# Patient Record
Sex: Female | Born: 1950 | Race: Black or African American | Hispanic: No | Marital: Single | Smoking: Never smoker
Health system: Southern US, Community
[De-identification: ages and names within clinical notes are randomized; demographics above are authoritative.]

## PROBLEM LIST (undated history)

## (undated) DIAGNOSIS — M109 Gout, unspecified: Secondary | ICD-10-CM

## (undated) DIAGNOSIS — K529 Noninfective gastroenteritis and colitis, unspecified: Secondary | ICD-10-CM

## (undated) DIAGNOSIS — N289 Disorder of kidney and ureter, unspecified: Secondary | ICD-10-CM

## (undated) DIAGNOSIS — I509 Heart failure, unspecified: Secondary | ICD-10-CM

## (undated) DIAGNOSIS — K56609 Unspecified intestinal obstruction, unspecified as to partial versus complete obstruction: Secondary | ICD-10-CM

## (undated) DIAGNOSIS — I10 Essential (primary) hypertension: Secondary | ICD-10-CM

## (undated) DIAGNOSIS — E119 Type 2 diabetes mellitus without complications: Secondary | ICD-10-CM

## (undated) HISTORY — PX: AV FISTULA PLACEMENT: SHX1204

## (undated) HISTORY — PX: CHOLECYSTECTOMY: SHX55

---

## 2015-12-18 DIAGNOSIS — N2581 Secondary hyperparathyroidism of renal origin: Secondary | ICD-10-CM | POA: Diagnosis not present

## 2015-12-18 DIAGNOSIS — N186 End stage renal disease: Secondary | ICD-10-CM | POA: Diagnosis not present

## 2015-12-18 DIAGNOSIS — D631 Anemia in chronic kidney disease: Secondary | ICD-10-CM | POA: Diagnosis not present

## 2015-12-18 DIAGNOSIS — E538 Deficiency of other specified B group vitamins: Secondary | ICD-10-CM | POA: Diagnosis not present

## 2015-12-18 DIAGNOSIS — E46 Unspecified protein-calorie malnutrition: Secondary | ICD-10-CM | POA: Diagnosis not present

## 2015-12-18 DIAGNOSIS — D509 Iron deficiency anemia, unspecified: Secondary | ICD-10-CM | POA: Diagnosis not present

## 2015-12-18 DIAGNOSIS — D649 Anemia, unspecified: Secondary | ICD-10-CM | POA: Diagnosis not present

## 2015-12-18 DIAGNOSIS — E875 Hyperkalemia: Secondary | ICD-10-CM | POA: Diagnosis not present

## 2015-12-18 DIAGNOSIS — R748 Abnormal levels of other serum enzymes: Secondary | ICD-10-CM | POA: Diagnosis not present

## 2015-12-20 DIAGNOSIS — Z79899 Other long term (current) drug therapy: Secondary | ICD-10-CM | POA: Diagnosis not present

## 2015-12-20 DIAGNOSIS — Z789 Other specified health status: Secondary | ICD-10-CM | POA: Diagnosis not present

## 2015-12-20 DIAGNOSIS — N2581 Secondary hyperparathyroidism of renal origin: Secondary | ICD-10-CM | POA: Diagnosis not present

## 2015-12-20 DIAGNOSIS — D649 Anemia, unspecified: Secondary | ICD-10-CM | POA: Diagnosis not present

## 2015-12-20 DIAGNOSIS — E559 Vitamin D deficiency, unspecified: Secondary | ICD-10-CM | POA: Diagnosis not present

## 2015-12-20 DIAGNOSIS — E538 Deficiency of other specified B group vitamins: Secondary | ICD-10-CM | POA: Diagnosis not present

## 2015-12-20 DIAGNOSIS — R7989 Other specified abnormal findings of blood chemistry: Secondary | ICD-10-CM | POA: Diagnosis not present

## 2015-12-20 DIAGNOSIS — E785 Hyperlipidemia, unspecified: Secondary | ICD-10-CM | POA: Diagnosis not present

## 2015-12-20 DIAGNOSIS — D631 Anemia in chronic kidney disease: Secondary | ICD-10-CM | POA: Diagnosis not present

## 2015-12-20 DIAGNOSIS — N186 End stage renal disease: Secondary | ICD-10-CM | POA: Diagnosis not present

## 2015-12-21 DIAGNOSIS — F332 Major depressive disorder, recurrent severe without psychotic features: Secondary | ICD-10-CM | POA: Diagnosis not present

## 2015-12-22 DIAGNOSIS — N186 End stage renal disease: Secondary | ICD-10-CM | POA: Diagnosis not present

## 2015-12-22 DIAGNOSIS — N2581 Secondary hyperparathyroidism of renal origin: Secondary | ICD-10-CM | POA: Diagnosis not present

## 2015-12-22 DIAGNOSIS — E538 Deficiency of other specified B group vitamins: Secondary | ICD-10-CM | POA: Diagnosis not present

## 2015-12-22 DIAGNOSIS — D649 Anemia, unspecified: Secondary | ICD-10-CM | POA: Diagnosis not present

## 2015-12-22 DIAGNOSIS — D631 Anemia in chronic kidney disease: Secondary | ICD-10-CM | POA: Diagnosis not present

## 2015-12-25 DIAGNOSIS — I10 Essential (primary) hypertension: Secondary | ICD-10-CM | POA: Diagnosis not present

## 2015-12-25 DIAGNOSIS — N2581 Secondary hyperparathyroidism of renal origin: Secondary | ICD-10-CM | POA: Diagnosis not present

## 2015-12-25 DIAGNOSIS — E785 Hyperlipidemia, unspecified: Secondary | ICD-10-CM | POA: Diagnosis not present

## 2015-12-25 DIAGNOSIS — D631 Anemia in chronic kidney disease: Secondary | ICD-10-CM | POA: Diagnosis not present

## 2015-12-25 DIAGNOSIS — E538 Deficiency of other specified B group vitamins: Secondary | ICD-10-CM | POA: Diagnosis not present

## 2015-12-25 DIAGNOSIS — N185 Chronic kidney disease, stage 5: Secondary | ICD-10-CM | POA: Diagnosis not present

## 2015-12-25 DIAGNOSIS — D649 Anemia, unspecified: Secondary | ICD-10-CM | POA: Diagnosis not present

## 2015-12-25 DIAGNOSIS — N186 End stage renal disease: Secondary | ICD-10-CM | POA: Diagnosis not present

## 2015-12-27 DIAGNOSIS — D649 Anemia, unspecified: Secondary | ICD-10-CM | POA: Diagnosis not present

## 2015-12-27 DIAGNOSIS — N2581 Secondary hyperparathyroidism of renal origin: Secondary | ICD-10-CM | POA: Diagnosis not present

## 2015-12-27 DIAGNOSIS — E538 Deficiency of other specified B group vitamins: Secondary | ICD-10-CM | POA: Diagnosis not present

## 2015-12-27 DIAGNOSIS — D631 Anemia in chronic kidney disease: Secondary | ICD-10-CM | POA: Diagnosis not present

## 2015-12-27 DIAGNOSIS — N186 End stage renal disease: Secondary | ICD-10-CM | POA: Diagnosis not present

## 2015-12-28 DIAGNOSIS — F332 Major depressive disorder, recurrent severe without psychotic features: Secondary | ICD-10-CM | POA: Diagnosis not present

## 2015-12-29 DIAGNOSIS — N2581 Secondary hyperparathyroidism of renal origin: Secondary | ICD-10-CM | POA: Diagnosis not present

## 2015-12-29 DIAGNOSIS — E538 Deficiency of other specified B group vitamins: Secondary | ICD-10-CM | POA: Diagnosis not present

## 2015-12-29 DIAGNOSIS — D649 Anemia, unspecified: Secondary | ICD-10-CM | POA: Diagnosis not present

## 2015-12-29 DIAGNOSIS — N186 End stage renal disease: Secondary | ICD-10-CM | POA: Diagnosis not present

## 2015-12-29 DIAGNOSIS — D631 Anemia in chronic kidney disease: Secondary | ICD-10-CM | POA: Diagnosis not present

## 2016-01-01 DIAGNOSIS — N2581 Secondary hyperparathyroidism of renal origin: Secondary | ICD-10-CM | POA: Diagnosis not present

## 2016-01-01 DIAGNOSIS — D631 Anemia in chronic kidney disease: Secondary | ICD-10-CM | POA: Diagnosis not present

## 2016-01-01 DIAGNOSIS — E538 Deficiency of other specified B group vitamins: Secondary | ICD-10-CM | POA: Diagnosis not present

## 2016-01-01 DIAGNOSIS — D649 Anemia, unspecified: Secondary | ICD-10-CM | POA: Diagnosis not present

## 2016-01-01 DIAGNOSIS — N186 End stage renal disease: Secondary | ICD-10-CM | POA: Diagnosis not present

## 2016-01-03 DIAGNOSIS — N186 End stage renal disease: Secondary | ICD-10-CM | POA: Diagnosis not present

## 2016-01-03 DIAGNOSIS — N2581 Secondary hyperparathyroidism of renal origin: Secondary | ICD-10-CM | POA: Diagnosis not present

## 2016-01-03 DIAGNOSIS — Z23 Encounter for immunization: Secondary | ICD-10-CM | POA: Diagnosis not present

## 2016-01-03 DIAGNOSIS — D631 Anemia in chronic kidney disease: Secondary | ICD-10-CM | POA: Diagnosis not present

## 2016-01-05 DIAGNOSIS — N2581 Secondary hyperparathyroidism of renal origin: Secondary | ICD-10-CM | POA: Diagnosis not present

## 2016-01-05 DIAGNOSIS — Z23 Encounter for immunization: Secondary | ICD-10-CM | POA: Diagnosis not present

## 2016-01-05 DIAGNOSIS — D631 Anemia in chronic kidney disease: Secondary | ICD-10-CM | POA: Diagnosis not present

## 2016-01-05 DIAGNOSIS — F332 Major depressive disorder, recurrent severe without psychotic features: Secondary | ICD-10-CM | POA: Diagnosis not present

## 2016-01-05 DIAGNOSIS — N186 End stage renal disease: Secondary | ICD-10-CM | POA: Diagnosis not present

## 2016-01-08 DIAGNOSIS — N2581 Secondary hyperparathyroidism of renal origin: Secondary | ICD-10-CM | POA: Diagnosis not present

## 2016-01-08 DIAGNOSIS — D631 Anemia in chronic kidney disease: Secondary | ICD-10-CM | POA: Diagnosis not present

## 2016-01-08 DIAGNOSIS — N186 End stage renal disease: Secondary | ICD-10-CM | POA: Diagnosis not present

## 2016-01-08 DIAGNOSIS — Z23 Encounter for immunization: Secondary | ICD-10-CM | POA: Diagnosis not present

## 2016-01-10 DIAGNOSIS — N186 End stage renal disease: Secondary | ICD-10-CM | POA: Diagnosis not present

## 2016-01-10 DIAGNOSIS — N2581 Secondary hyperparathyroidism of renal origin: Secondary | ICD-10-CM | POA: Diagnosis not present

## 2016-01-10 DIAGNOSIS — Z23 Encounter for immunization: Secondary | ICD-10-CM | POA: Diagnosis not present

## 2016-01-10 DIAGNOSIS — D631 Anemia in chronic kidney disease: Secondary | ICD-10-CM | POA: Diagnosis not present

## 2016-01-12 DIAGNOSIS — N2581 Secondary hyperparathyroidism of renal origin: Secondary | ICD-10-CM | POA: Diagnosis not present

## 2016-01-12 DIAGNOSIS — D631 Anemia in chronic kidney disease: Secondary | ICD-10-CM | POA: Diagnosis not present

## 2016-01-12 DIAGNOSIS — Z23 Encounter for immunization: Secondary | ICD-10-CM | POA: Diagnosis not present

## 2016-01-12 DIAGNOSIS — F332 Major depressive disorder, recurrent severe without psychotic features: Secondary | ICD-10-CM | POA: Diagnosis not present

## 2016-01-12 DIAGNOSIS — N186 End stage renal disease: Secondary | ICD-10-CM | POA: Diagnosis not present

## 2016-01-15 DIAGNOSIS — D631 Anemia in chronic kidney disease: Secondary | ICD-10-CM | POA: Diagnosis not present

## 2016-01-15 DIAGNOSIS — N2581 Secondary hyperparathyroidism of renal origin: Secondary | ICD-10-CM | POA: Diagnosis not present

## 2016-01-15 DIAGNOSIS — N186 End stage renal disease: Secondary | ICD-10-CM | POA: Diagnosis not present

## 2016-01-15 DIAGNOSIS — Z23 Encounter for immunization: Secondary | ICD-10-CM | POA: Diagnosis not present

## 2016-01-16 DIAGNOSIS — N186 End stage renal disease: Secondary | ICD-10-CM | POA: Diagnosis not present

## 2016-01-17 DIAGNOSIS — R748 Abnormal levels of other serum enzymes: Secondary | ICD-10-CM | POA: Diagnosis not present

## 2016-01-17 DIAGNOSIS — E785 Hyperlipidemia, unspecified: Secondary | ICD-10-CM | POA: Diagnosis not present

## 2016-01-17 DIAGNOSIS — D509 Iron deficiency anemia, unspecified: Secondary | ICD-10-CM | POA: Diagnosis not present

## 2016-01-17 DIAGNOSIS — E46 Unspecified protein-calorie malnutrition: Secondary | ICD-10-CM | POA: Diagnosis not present

## 2016-01-17 DIAGNOSIS — E875 Hyperkalemia: Secondary | ICD-10-CM | POA: Diagnosis not present

## 2016-01-17 DIAGNOSIS — N186 End stage renal disease: Secondary | ICD-10-CM | POA: Diagnosis not present

## 2016-01-17 DIAGNOSIS — N2581 Secondary hyperparathyroidism of renal origin: Secondary | ICD-10-CM | POA: Diagnosis not present

## 2016-01-17 DIAGNOSIS — D649 Anemia, unspecified: Secondary | ICD-10-CM | POA: Diagnosis not present

## 2016-01-17 DIAGNOSIS — D631 Anemia in chronic kidney disease: Secondary | ICD-10-CM | POA: Diagnosis not present

## 2016-01-17 DIAGNOSIS — Z79899 Other long term (current) drug therapy: Secondary | ICD-10-CM | POA: Diagnosis not present

## 2016-01-17 DIAGNOSIS — E538 Deficiency of other specified B group vitamins: Secondary | ICD-10-CM | POA: Diagnosis not present

## 2016-01-18 DIAGNOSIS — I701 Atherosclerosis of renal artery: Secondary | ICD-10-CM | POA: Diagnosis not present

## 2016-01-18 DIAGNOSIS — N185 Chronic kidney disease, stage 5: Secondary | ICD-10-CM | POA: Diagnosis not present

## 2016-01-18 DIAGNOSIS — E785 Hyperlipidemia, unspecified: Secondary | ICD-10-CM | POA: Diagnosis not present

## 2016-01-18 DIAGNOSIS — I10 Essential (primary) hypertension: Secondary | ICD-10-CM | POA: Diagnosis not present

## 2016-01-19 DIAGNOSIS — N2581 Secondary hyperparathyroidism of renal origin: Secondary | ICD-10-CM | POA: Diagnosis not present

## 2016-01-19 DIAGNOSIS — N186 End stage renal disease: Secondary | ICD-10-CM | POA: Diagnosis not present

## 2016-01-19 DIAGNOSIS — D631 Anemia in chronic kidney disease: Secondary | ICD-10-CM | POA: Diagnosis not present

## 2016-01-19 DIAGNOSIS — E538 Deficiency of other specified B group vitamins: Secondary | ICD-10-CM | POA: Diagnosis not present

## 2016-01-19 DIAGNOSIS — D649 Anemia, unspecified: Secondary | ICD-10-CM | POA: Diagnosis not present

## 2016-01-22 DIAGNOSIS — D649 Anemia, unspecified: Secondary | ICD-10-CM | POA: Diagnosis not present

## 2016-01-22 DIAGNOSIS — N186 End stage renal disease: Secondary | ICD-10-CM | POA: Diagnosis not present

## 2016-01-22 DIAGNOSIS — E538 Deficiency of other specified B group vitamins: Secondary | ICD-10-CM | POA: Diagnosis not present

## 2016-01-22 DIAGNOSIS — N2581 Secondary hyperparathyroidism of renal origin: Secondary | ICD-10-CM | POA: Diagnosis not present

## 2016-01-22 DIAGNOSIS — D631 Anemia in chronic kidney disease: Secondary | ICD-10-CM | POA: Diagnosis not present

## 2016-01-24 DIAGNOSIS — D649 Anemia, unspecified: Secondary | ICD-10-CM | POA: Diagnosis not present

## 2016-01-24 DIAGNOSIS — E538 Deficiency of other specified B group vitamins: Secondary | ICD-10-CM | POA: Diagnosis not present

## 2016-01-24 DIAGNOSIS — D631 Anemia in chronic kidney disease: Secondary | ICD-10-CM | POA: Diagnosis not present

## 2016-01-24 DIAGNOSIS — N186 End stage renal disease: Secondary | ICD-10-CM | POA: Diagnosis not present

## 2016-01-24 DIAGNOSIS — N2581 Secondary hyperparathyroidism of renal origin: Secondary | ICD-10-CM | POA: Diagnosis not present

## 2016-01-25 DIAGNOSIS — D631 Anemia in chronic kidney disease: Secondary | ICD-10-CM | POA: Diagnosis not present

## 2016-01-25 DIAGNOSIS — T82590A Other mechanical complication of surgically created arteriovenous fistula, initial encounter: Secondary | ICD-10-CM | POA: Diagnosis not present

## 2016-01-25 DIAGNOSIS — I10 Essential (primary) hypertension: Secondary | ICD-10-CM | POA: Diagnosis not present

## 2016-01-25 DIAGNOSIS — N185 Chronic kidney disease, stage 5: Secondary | ICD-10-CM | POA: Diagnosis not present

## 2016-01-25 DIAGNOSIS — T82868A Thrombosis of vascular prosthetic devices, implants and grafts, initial encounter: Secondary | ICD-10-CM | POA: Diagnosis not present

## 2016-01-25 DIAGNOSIS — I701 Atherosclerosis of renal artery: Secondary | ICD-10-CM | POA: Diagnosis not present

## 2016-01-26 DIAGNOSIS — N186 End stage renal disease: Secondary | ICD-10-CM | POA: Diagnosis not present

## 2016-01-26 DIAGNOSIS — N2581 Secondary hyperparathyroidism of renal origin: Secondary | ICD-10-CM | POA: Diagnosis not present

## 2016-01-26 DIAGNOSIS — D649 Anemia, unspecified: Secondary | ICD-10-CM | POA: Diagnosis not present

## 2016-01-26 DIAGNOSIS — E538 Deficiency of other specified B group vitamins: Secondary | ICD-10-CM | POA: Diagnosis not present

## 2016-01-26 DIAGNOSIS — D631 Anemia in chronic kidney disease: Secondary | ICD-10-CM | POA: Diagnosis not present

## 2016-01-29 DIAGNOSIS — N186 End stage renal disease: Secondary | ICD-10-CM | POA: Diagnosis not present

## 2016-01-29 DIAGNOSIS — D631 Anemia in chronic kidney disease: Secondary | ICD-10-CM | POA: Diagnosis not present

## 2016-01-29 DIAGNOSIS — N2581 Secondary hyperparathyroidism of renal origin: Secondary | ICD-10-CM | POA: Diagnosis not present

## 2016-01-29 DIAGNOSIS — D649 Anemia, unspecified: Secondary | ICD-10-CM | POA: Diagnosis not present

## 2016-01-29 DIAGNOSIS — E538 Deficiency of other specified B group vitamins: Secondary | ICD-10-CM | POA: Diagnosis not present

## 2016-01-31 DIAGNOSIS — N2581 Secondary hyperparathyroidism of renal origin: Secondary | ICD-10-CM | POA: Diagnosis not present

## 2016-01-31 DIAGNOSIS — E46 Unspecified protein-calorie malnutrition: Secondary | ICD-10-CM | POA: Diagnosis not present

## 2016-01-31 DIAGNOSIS — N186 End stage renal disease: Secondary | ICD-10-CM | POA: Diagnosis not present

## 2016-02-02 DIAGNOSIS — E46 Unspecified protein-calorie malnutrition: Secondary | ICD-10-CM | POA: Diagnosis not present

## 2016-02-02 DIAGNOSIS — N2581 Secondary hyperparathyroidism of renal origin: Secondary | ICD-10-CM | POA: Diagnosis not present

## 2016-02-02 DIAGNOSIS — N186 End stage renal disease: Secondary | ICD-10-CM | POA: Diagnosis not present

## 2016-02-05 DIAGNOSIS — E46 Unspecified protein-calorie malnutrition: Secondary | ICD-10-CM | POA: Diagnosis not present

## 2016-02-05 DIAGNOSIS — N186 End stage renal disease: Secondary | ICD-10-CM | POA: Diagnosis not present

## 2016-02-05 DIAGNOSIS — N2581 Secondary hyperparathyroidism of renal origin: Secondary | ICD-10-CM | POA: Diagnosis not present

## 2016-02-06 DIAGNOSIS — E46 Unspecified protein-calorie malnutrition: Secondary | ICD-10-CM | POA: Diagnosis not present

## 2016-02-06 DIAGNOSIS — N2581 Secondary hyperparathyroidism of renal origin: Secondary | ICD-10-CM | POA: Diagnosis not present

## 2016-02-06 DIAGNOSIS — N186 End stage renal disease: Secondary | ICD-10-CM | POA: Diagnosis not present

## 2016-02-08 DIAGNOSIS — N186 End stage renal disease: Secondary | ICD-10-CM | POA: Diagnosis not present

## 2016-02-08 DIAGNOSIS — L299 Pruritus, unspecified: Secondary | ICD-10-CM | POA: Diagnosis not present

## 2016-02-08 DIAGNOSIS — Z992 Dependence on renal dialysis: Secondary | ICD-10-CM | POA: Diagnosis not present

## 2016-02-08 DIAGNOSIS — F419 Anxiety disorder, unspecified: Secondary | ICD-10-CM | POA: Diagnosis not present

## 2016-02-10 DIAGNOSIS — Z992 Dependence on renal dialysis: Secondary | ICD-10-CM | POA: Diagnosis not present

## 2016-02-10 DIAGNOSIS — L299 Pruritus, unspecified: Secondary | ICD-10-CM | POA: Diagnosis not present

## 2016-02-10 DIAGNOSIS — N186 End stage renal disease: Secondary | ICD-10-CM | POA: Diagnosis not present

## 2016-02-10 DIAGNOSIS — F419 Anxiety disorder, unspecified: Secondary | ICD-10-CM | POA: Diagnosis not present

## 2016-02-12 DIAGNOSIS — L299 Pruritus, unspecified: Secondary | ICD-10-CM | POA: Diagnosis not present

## 2016-02-12 DIAGNOSIS — Z992 Dependence on renal dialysis: Secondary | ICD-10-CM | POA: Diagnosis not present

## 2016-02-12 DIAGNOSIS — N186 End stage renal disease: Secondary | ICD-10-CM | POA: Diagnosis not present

## 2016-02-12 DIAGNOSIS — F419 Anxiety disorder, unspecified: Secondary | ICD-10-CM | POA: Diagnosis not present

## 2016-02-13 DIAGNOSIS — N186 End stage renal disease: Secondary | ICD-10-CM | POA: Diagnosis not present

## 2016-02-14 DIAGNOSIS — N186 End stage renal disease: Secondary | ICD-10-CM | POA: Diagnosis not present

## 2016-02-14 DIAGNOSIS — Z992 Dependence on renal dialysis: Secondary | ICD-10-CM | POA: Diagnosis not present

## 2016-02-14 DIAGNOSIS — F419 Anxiety disorder, unspecified: Secondary | ICD-10-CM | POA: Diagnosis not present

## 2016-02-14 DIAGNOSIS — L299 Pruritus, unspecified: Secondary | ICD-10-CM | POA: Diagnosis not present

## 2016-02-16 DIAGNOSIS — N186 End stage renal disease: Secondary | ICD-10-CM | POA: Diagnosis not present

## 2016-02-16 DIAGNOSIS — Z992 Dependence on renal dialysis: Secondary | ICD-10-CM | POA: Diagnosis not present

## 2016-02-16 DIAGNOSIS — F419 Anxiety disorder, unspecified: Secondary | ICD-10-CM | POA: Diagnosis not present

## 2016-02-16 DIAGNOSIS — L299 Pruritus, unspecified: Secondary | ICD-10-CM | POA: Diagnosis not present

## 2016-02-19 DIAGNOSIS — Z992 Dependence on renal dialysis: Secondary | ICD-10-CM | POA: Diagnosis not present

## 2016-02-19 DIAGNOSIS — F419 Anxiety disorder, unspecified: Secondary | ICD-10-CM | POA: Diagnosis not present

## 2016-02-19 DIAGNOSIS — L299 Pruritus, unspecified: Secondary | ICD-10-CM | POA: Diagnosis not present

## 2016-02-19 DIAGNOSIS — N186 End stage renal disease: Secondary | ICD-10-CM | POA: Diagnosis not present

## 2016-02-21 DIAGNOSIS — E785 Hyperlipidemia, unspecified: Secondary | ICD-10-CM | POA: Diagnosis not present

## 2016-02-21 DIAGNOSIS — E46 Unspecified protein-calorie malnutrition: Secondary | ICD-10-CM | POA: Diagnosis not present

## 2016-02-21 DIAGNOSIS — N2581 Secondary hyperparathyroidism of renal origin: Secondary | ICD-10-CM | POA: Diagnosis not present

## 2016-02-21 DIAGNOSIS — N186 End stage renal disease: Secondary | ICD-10-CM | POA: Diagnosis not present

## 2016-02-21 DIAGNOSIS — Z79899 Other long term (current) drug therapy: Secondary | ICD-10-CM | POA: Diagnosis not present

## 2016-02-22 DIAGNOSIS — N186 End stage renal disease: Secondary | ICD-10-CM | POA: Diagnosis not present

## 2016-02-22 DIAGNOSIS — T82590A Other mechanical complication of surgically created arteriovenous fistula, initial encounter: Secondary | ICD-10-CM | POA: Diagnosis not present

## 2016-02-22 DIAGNOSIS — E8779 Other fluid overload: Secondary | ICD-10-CM | POA: Diagnosis not present

## 2016-02-23 DIAGNOSIS — E46 Unspecified protein-calorie malnutrition: Secondary | ICD-10-CM | POA: Diagnosis not present

## 2016-02-23 DIAGNOSIS — N2581 Secondary hyperparathyroidism of renal origin: Secondary | ICD-10-CM | POA: Diagnosis not present

## 2016-02-23 DIAGNOSIS — N186 End stage renal disease: Secondary | ICD-10-CM | POA: Diagnosis not present

## 2016-02-23 DIAGNOSIS — F332 Major depressive disorder, recurrent severe without psychotic features: Secondary | ICD-10-CM | POA: Diagnosis not present

## 2016-02-26 DIAGNOSIS — E46 Unspecified protein-calorie malnutrition: Secondary | ICD-10-CM | POA: Diagnosis not present

## 2016-02-26 DIAGNOSIS — N2581 Secondary hyperparathyroidism of renal origin: Secondary | ICD-10-CM | POA: Diagnosis not present

## 2016-02-26 DIAGNOSIS — N186 End stage renal disease: Secondary | ICD-10-CM | POA: Diagnosis not present

## 2016-02-28 DIAGNOSIS — N2581 Secondary hyperparathyroidism of renal origin: Secondary | ICD-10-CM | POA: Diagnosis not present

## 2016-02-28 DIAGNOSIS — E46 Unspecified protein-calorie malnutrition: Secondary | ICD-10-CM | POA: Diagnosis not present

## 2016-02-28 DIAGNOSIS — N186 End stage renal disease: Secondary | ICD-10-CM | POA: Diagnosis not present

## 2016-03-01 DIAGNOSIS — N2581 Secondary hyperparathyroidism of renal origin: Secondary | ICD-10-CM | POA: Diagnosis not present

## 2016-03-01 DIAGNOSIS — N186 End stage renal disease: Secondary | ICD-10-CM | POA: Diagnosis not present

## 2016-03-04 DIAGNOSIS — N2581 Secondary hyperparathyroidism of renal origin: Secondary | ICD-10-CM | POA: Diagnosis not present

## 2016-03-04 DIAGNOSIS — N186 End stage renal disease: Secondary | ICD-10-CM | POA: Diagnosis not present

## 2016-03-06 DIAGNOSIS — N2581 Secondary hyperparathyroidism of renal origin: Secondary | ICD-10-CM | POA: Diagnosis not present

## 2016-03-06 DIAGNOSIS — N186 End stage renal disease: Secondary | ICD-10-CM | POA: Diagnosis not present

## 2016-03-08 DIAGNOSIS — N186 End stage renal disease: Secondary | ICD-10-CM | POA: Diagnosis not present

## 2016-03-08 DIAGNOSIS — N2581 Secondary hyperparathyroidism of renal origin: Secondary | ICD-10-CM | POA: Diagnosis not present

## 2016-03-08 DIAGNOSIS — F332 Major depressive disorder, recurrent severe without psychotic features: Secondary | ICD-10-CM | POA: Diagnosis not present

## 2016-03-11 DIAGNOSIS — N2581 Secondary hyperparathyroidism of renal origin: Secondary | ICD-10-CM | POA: Diagnosis not present

## 2016-03-11 DIAGNOSIS — N186 End stage renal disease: Secondary | ICD-10-CM | POA: Diagnosis not present

## 2016-03-13 DIAGNOSIS — N186 End stage renal disease: Secondary | ICD-10-CM | POA: Diagnosis not present

## 2016-03-13 DIAGNOSIS — N2581 Secondary hyperparathyroidism of renal origin: Secondary | ICD-10-CM | POA: Diagnosis not present

## 2016-03-15 DIAGNOSIS — F332 Major depressive disorder, recurrent severe without psychotic features: Secondary | ICD-10-CM | POA: Diagnosis not present

## 2016-03-15 DIAGNOSIS — N2581 Secondary hyperparathyroidism of renal origin: Secondary | ICD-10-CM | POA: Diagnosis not present

## 2016-03-15 DIAGNOSIS — N186 End stage renal disease: Secondary | ICD-10-CM | POA: Diagnosis not present

## 2016-03-18 DIAGNOSIS — D509 Iron deficiency anemia, unspecified: Secondary | ICD-10-CM | POA: Diagnosis not present

## 2016-03-18 DIAGNOSIS — E875 Hyperkalemia: Secondary | ICD-10-CM | POA: Diagnosis not present

## 2016-03-18 DIAGNOSIS — D631 Anemia in chronic kidney disease: Secondary | ICD-10-CM | POA: Diagnosis not present

## 2016-03-18 DIAGNOSIS — D649 Anemia, unspecified: Secondary | ICD-10-CM | POA: Diagnosis not present

## 2016-03-18 DIAGNOSIS — R748 Abnormal levels of other serum enzymes: Secondary | ICD-10-CM | POA: Diagnosis not present

## 2016-03-18 DIAGNOSIS — E46 Unspecified protein-calorie malnutrition: Secondary | ICD-10-CM | POA: Diagnosis not present

## 2016-03-18 DIAGNOSIS — N186 End stage renal disease: Secondary | ICD-10-CM | POA: Diagnosis not present

## 2016-03-18 DIAGNOSIS — N2581 Secondary hyperparathyroidism of renal origin: Secondary | ICD-10-CM | POA: Diagnosis not present

## 2016-03-18 DIAGNOSIS — E538 Deficiency of other specified B group vitamins: Secondary | ICD-10-CM | POA: Diagnosis not present

## 2016-03-19 DIAGNOSIS — I10 Essential (primary) hypertension: Secondary | ICD-10-CM | POA: Diagnosis not present

## 2016-03-19 DIAGNOSIS — E785 Hyperlipidemia, unspecified: Secondary | ICD-10-CM | POA: Diagnosis not present

## 2016-03-19 DIAGNOSIS — N186 End stage renal disease: Secondary | ICD-10-CM | POA: Diagnosis not present

## 2016-03-19 DIAGNOSIS — I770 Arteriovenous fistula, acquired: Secondary | ICD-10-CM | POA: Diagnosis not present

## 2016-03-20 DIAGNOSIS — N186 End stage renal disease: Secondary | ICD-10-CM | POA: Diagnosis not present

## 2016-03-20 DIAGNOSIS — D631 Anemia in chronic kidney disease: Secondary | ICD-10-CM | POA: Diagnosis not present

## 2016-03-20 DIAGNOSIS — E538 Deficiency of other specified B group vitamins: Secondary | ICD-10-CM | POA: Diagnosis not present

## 2016-03-20 DIAGNOSIS — D649 Anemia, unspecified: Secondary | ICD-10-CM | POA: Diagnosis not present

## 2016-03-20 DIAGNOSIS — Z79899 Other long term (current) drug therapy: Secondary | ICD-10-CM | POA: Diagnosis not present

## 2016-03-20 DIAGNOSIS — N2581 Secondary hyperparathyroidism of renal origin: Secondary | ICD-10-CM | POA: Diagnosis not present

## 2016-03-20 DIAGNOSIS — E785 Hyperlipidemia, unspecified: Secondary | ICD-10-CM | POA: Diagnosis not present

## 2016-03-22 DIAGNOSIS — F332 Major depressive disorder, recurrent severe without psychotic features: Secondary | ICD-10-CM | POA: Diagnosis not present

## 2016-03-22 DIAGNOSIS — E538 Deficiency of other specified B group vitamins: Secondary | ICD-10-CM | POA: Diagnosis not present

## 2016-03-22 DIAGNOSIS — D649 Anemia, unspecified: Secondary | ICD-10-CM | POA: Diagnosis not present

## 2016-03-22 DIAGNOSIS — D631 Anemia in chronic kidney disease: Secondary | ICD-10-CM | POA: Diagnosis not present

## 2016-03-22 DIAGNOSIS — N186 End stage renal disease: Secondary | ICD-10-CM | POA: Diagnosis not present

## 2016-03-22 DIAGNOSIS — N2581 Secondary hyperparathyroidism of renal origin: Secondary | ICD-10-CM | POA: Diagnosis not present

## 2016-03-25 DIAGNOSIS — N186 End stage renal disease: Secondary | ICD-10-CM | POA: Diagnosis not present

## 2016-03-25 DIAGNOSIS — E785 Hyperlipidemia, unspecified: Secondary | ICD-10-CM | POA: Diagnosis not present

## 2016-03-25 DIAGNOSIS — D649 Anemia, unspecified: Secondary | ICD-10-CM | POA: Diagnosis not present

## 2016-03-25 DIAGNOSIS — I10 Essential (primary) hypertension: Secondary | ICD-10-CM | POA: Diagnosis not present

## 2016-03-25 DIAGNOSIS — D631 Anemia in chronic kidney disease: Secondary | ICD-10-CM | POA: Diagnosis not present

## 2016-03-25 DIAGNOSIS — N185 Chronic kidney disease, stage 5: Secondary | ICD-10-CM | POA: Diagnosis not present

## 2016-03-25 DIAGNOSIS — N2581 Secondary hyperparathyroidism of renal origin: Secondary | ICD-10-CM | POA: Diagnosis not present

## 2016-03-25 DIAGNOSIS — E538 Deficiency of other specified B group vitamins: Secondary | ICD-10-CM | POA: Diagnosis not present

## 2016-03-27 DIAGNOSIS — N2581 Secondary hyperparathyroidism of renal origin: Secondary | ICD-10-CM | POA: Diagnosis not present

## 2016-03-27 DIAGNOSIS — N186 End stage renal disease: Secondary | ICD-10-CM | POA: Diagnosis not present

## 2016-03-27 DIAGNOSIS — D631 Anemia in chronic kidney disease: Secondary | ICD-10-CM | POA: Diagnosis not present

## 2016-03-27 DIAGNOSIS — Z23 Encounter for immunization: Secondary | ICD-10-CM | POA: Diagnosis not present

## 2016-03-28 DIAGNOSIS — I10 Essential (primary) hypertension: Secondary | ICD-10-CM | POA: Diagnosis not present

## 2016-03-28 DIAGNOSIS — E559 Vitamin D deficiency, unspecified: Secondary | ICD-10-CM | POA: Diagnosis not present

## 2016-03-28 DIAGNOSIS — E119 Type 2 diabetes mellitus without complications: Secondary | ICD-10-CM | POA: Diagnosis not present

## 2016-03-29 DIAGNOSIS — D631 Anemia in chronic kidney disease: Secondary | ICD-10-CM | POA: Diagnosis not present

## 2016-03-29 DIAGNOSIS — F332 Major depressive disorder, recurrent severe without psychotic features: Secondary | ICD-10-CM | POA: Diagnosis not present

## 2016-03-29 DIAGNOSIS — N186 End stage renal disease: Secondary | ICD-10-CM | POA: Diagnosis not present

## 2016-03-29 DIAGNOSIS — N2581 Secondary hyperparathyroidism of renal origin: Secondary | ICD-10-CM | POA: Diagnosis not present

## 2016-03-29 DIAGNOSIS — Z23 Encounter for immunization: Secondary | ICD-10-CM | POA: Diagnosis not present

## 2016-04-01 DIAGNOSIS — D631 Anemia in chronic kidney disease: Secondary | ICD-10-CM | POA: Diagnosis not present

## 2016-04-01 DIAGNOSIS — N2581 Secondary hyperparathyroidism of renal origin: Secondary | ICD-10-CM | POA: Diagnosis not present

## 2016-04-01 DIAGNOSIS — Z23 Encounter for immunization: Secondary | ICD-10-CM | POA: Diagnosis not present

## 2016-04-01 DIAGNOSIS — N186 End stage renal disease: Secondary | ICD-10-CM | POA: Diagnosis not present

## 2016-04-03 DIAGNOSIS — I120 Hypertensive chronic kidney disease with stage 5 chronic kidney disease or end stage renal disease: Secondary | ICD-10-CM | POA: Diagnosis not present

## 2016-04-03 DIAGNOSIS — N2581 Secondary hyperparathyroidism of renal origin: Secondary | ICD-10-CM | POA: Diagnosis not present

## 2016-04-03 DIAGNOSIS — M549 Dorsalgia, unspecified: Secondary | ICD-10-CM | POA: Diagnosis not present

## 2016-04-03 DIAGNOSIS — Z23 Encounter for immunization: Secondary | ICD-10-CM | POA: Diagnosis not present

## 2016-04-03 DIAGNOSIS — E119 Type 2 diabetes mellitus without complications: Secondary | ICD-10-CM | POA: Diagnosis not present

## 2016-04-03 DIAGNOSIS — N186 End stage renal disease: Secondary | ICD-10-CM | POA: Diagnosis not present

## 2016-04-03 DIAGNOSIS — I509 Heart failure, unspecified: Secondary | ICD-10-CM | POA: Diagnosis not present

## 2016-04-03 DIAGNOSIS — D631 Anemia in chronic kidney disease: Secondary | ICD-10-CM | POA: Diagnosis not present

## 2016-04-04 DIAGNOSIS — E1165 Type 2 diabetes mellitus with hyperglycemia: Secondary | ICD-10-CM | POA: Diagnosis not present

## 2016-04-04 DIAGNOSIS — I10 Essential (primary) hypertension: Secondary | ICD-10-CM | POA: Diagnosis not present

## 2016-04-04 DIAGNOSIS — E785 Hyperlipidemia, unspecified: Secondary | ICD-10-CM | POA: Diagnosis not present

## 2016-04-05 DIAGNOSIS — N186 End stage renal disease: Secondary | ICD-10-CM | POA: Diagnosis not present

## 2016-04-05 DIAGNOSIS — Z23 Encounter for immunization: Secondary | ICD-10-CM | POA: Diagnosis not present

## 2016-04-05 DIAGNOSIS — D631 Anemia in chronic kidney disease: Secondary | ICD-10-CM | POA: Diagnosis not present

## 2016-04-05 DIAGNOSIS — N2581 Secondary hyperparathyroidism of renal origin: Secondary | ICD-10-CM | POA: Diagnosis not present

## 2016-04-05 DIAGNOSIS — F332 Major depressive disorder, recurrent severe without psychotic features: Secondary | ICD-10-CM | POA: Diagnosis not present

## 2016-04-08 DIAGNOSIS — Z23 Encounter for immunization: Secondary | ICD-10-CM | POA: Diagnosis not present

## 2016-04-08 DIAGNOSIS — N186 End stage renal disease: Secondary | ICD-10-CM | POA: Diagnosis not present

## 2016-04-08 DIAGNOSIS — D631 Anemia in chronic kidney disease: Secondary | ICD-10-CM | POA: Diagnosis not present

## 2016-04-08 DIAGNOSIS — N2581 Secondary hyperparathyroidism of renal origin: Secondary | ICD-10-CM | POA: Diagnosis not present

## 2016-04-10 DIAGNOSIS — N2581 Secondary hyperparathyroidism of renal origin: Secondary | ICD-10-CM | POA: Diagnosis not present

## 2016-04-10 DIAGNOSIS — N186 End stage renal disease: Secondary | ICD-10-CM | POA: Diagnosis not present

## 2016-04-11 DIAGNOSIS — Z992 Dependence on renal dialysis: Secondary | ICD-10-CM | POA: Diagnosis not present

## 2016-04-11 DIAGNOSIS — Z794 Long term (current) use of insulin: Secondary | ICD-10-CM | POA: Diagnosis not present

## 2016-04-11 DIAGNOSIS — I5032 Chronic diastolic (congestive) heart failure: Secondary | ICD-10-CM | POA: Diagnosis not present

## 2016-04-11 DIAGNOSIS — Z7901 Long term (current) use of anticoagulants: Secondary | ICD-10-CM | POA: Diagnosis not present

## 2016-04-11 DIAGNOSIS — E1122 Type 2 diabetes mellitus with diabetic chronic kidney disease: Secondary | ICD-10-CM | POA: Diagnosis not present

## 2016-04-11 DIAGNOSIS — I120 Hypertensive chronic kidney disease with stage 5 chronic kidney disease or end stage renal disease: Secondary | ICD-10-CM | POA: Diagnosis not present

## 2016-04-11 DIAGNOSIS — N186 End stage renal disease: Secondary | ICD-10-CM | POA: Diagnosis not present

## 2016-04-12 DIAGNOSIS — N2581 Secondary hyperparathyroidism of renal origin: Secondary | ICD-10-CM | POA: Diagnosis not present

## 2016-04-12 DIAGNOSIS — F332 Major depressive disorder, recurrent severe without psychotic features: Secondary | ICD-10-CM | POA: Diagnosis not present

## 2016-04-12 DIAGNOSIS — N186 End stage renal disease: Secondary | ICD-10-CM | POA: Diagnosis not present

## 2016-04-13 DIAGNOSIS — R0689 Other abnormalities of breathing: Secondary | ICD-10-CM | POA: Diagnosis not present

## 2016-04-14 DIAGNOSIS — T82868A Thrombosis of vascular prosthetic devices, implants and grafts, initial encounter: Secondary | ICD-10-CM | POA: Diagnosis not present

## 2016-04-14 DIAGNOSIS — R1013 Epigastric pain: Secondary | ICD-10-CM | POA: Diagnosis not present

## 2016-04-14 DIAGNOSIS — K219 Gastro-esophageal reflux disease without esophagitis: Secondary | ICD-10-CM | POA: Diagnosis present

## 2016-04-14 DIAGNOSIS — G4733 Obstructive sleep apnea (adult) (pediatric): Secondary | ICD-10-CM | POA: Diagnosis present

## 2016-04-14 DIAGNOSIS — I953 Hypotension of hemodialysis: Secondary | ICD-10-CM | POA: Diagnosis not present

## 2016-04-14 DIAGNOSIS — I5032 Chronic diastolic (congestive) heart failure: Secondary | ICD-10-CM | POA: Diagnosis not present

## 2016-04-14 DIAGNOSIS — Z992 Dependence on renal dialysis: Secondary | ICD-10-CM | POA: Diagnosis not present

## 2016-04-14 DIAGNOSIS — E875 Hyperkalemia: Secondary | ICD-10-CM | POA: Diagnosis not present

## 2016-04-14 DIAGNOSIS — R9431 Abnormal electrocardiogram [ECG] [EKG]: Secondary | ICD-10-CM | POA: Diagnosis not present

## 2016-04-14 DIAGNOSIS — N179 Acute kidney failure, unspecified: Secondary | ICD-10-CM | POA: Diagnosis not present

## 2016-04-14 DIAGNOSIS — T82898A Other specified complication of vascular prosthetic devices, implants and grafts, initial encounter: Secondary | ICD-10-CM | POA: Diagnosis not present

## 2016-04-14 DIAGNOSIS — I509 Heart failure, unspecified: Secondary | ICD-10-CM | POA: Diagnosis not present

## 2016-04-14 DIAGNOSIS — N186 End stage renal disease: Secondary | ICD-10-CM | POA: Diagnosis not present

## 2016-04-14 DIAGNOSIS — E785 Hyperlipidemia, unspecified: Secondary | ICD-10-CM | POA: Diagnosis present

## 2016-04-14 DIAGNOSIS — I272 Other secondary pulmonary hypertension: Secondary | ICD-10-CM | POA: Diagnosis not present

## 2016-04-14 DIAGNOSIS — Z6841 Body Mass Index (BMI) 40.0 and over, adult: Secondary | ICD-10-CM | POA: Diagnosis not present

## 2016-04-14 DIAGNOSIS — R0600 Dyspnea, unspecified: Secondary | ICD-10-CM | POA: Diagnosis not present

## 2016-04-14 DIAGNOSIS — T82858A Stenosis of vascular prosthetic devices, implants and grafts, initial encounter: Secondary | ICD-10-CM | POA: Diagnosis not present

## 2016-04-14 DIAGNOSIS — M5020 Other cervical disc displacement, unspecified cervical region: Secondary | ICD-10-CM | POA: Diagnosis not present

## 2016-04-14 DIAGNOSIS — E1122 Type 2 diabetes mellitus with diabetic chronic kidney disease: Secondary | ICD-10-CM | POA: Diagnosis present

## 2016-04-14 DIAGNOSIS — Z0189 Encounter for other specified special examinations: Secondary | ICD-10-CM | POA: Diagnosis not present

## 2016-04-14 DIAGNOSIS — I132 Hypertensive heart and chronic kidney disease with heart failure and with stage 5 chronic kidney disease, or end stage renal disease: Secondary | ICD-10-CM | POA: Diagnosis not present

## 2016-04-14 DIAGNOSIS — N189 Chronic kidney disease, unspecified: Secondary | ICD-10-CM | POA: Diagnosis not present

## 2016-04-14 DIAGNOSIS — R109 Unspecified abdominal pain: Secondary | ICD-10-CM | POA: Diagnosis not present

## 2016-04-14 DIAGNOSIS — K8020 Calculus of gallbladder without cholecystitis without obstruction: Secondary | ICD-10-CM | POA: Diagnosis not present

## 2016-04-14 DIAGNOSIS — I5030 Unspecified diastolic (congestive) heart failure: Secondary | ICD-10-CM | POA: Diagnosis not present

## 2016-04-14 DIAGNOSIS — D631 Anemia in chronic kidney disease: Secondary | ICD-10-CM | POA: Diagnosis present

## 2016-04-14 DIAGNOSIS — T82838A Hemorrhage of vascular prosthetic devices, implants and grafts, initial encounter: Secondary | ICD-10-CM | POA: Diagnosis not present

## 2016-04-14 DIAGNOSIS — I519 Heart disease, unspecified: Secondary | ICD-10-CM | POA: Diagnosis not present

## 2016-04-14 DIAGNOSIS — R0602 Shortness of breath: Secondary | ICD-10-CM | POA: Diagnosis not present

## 2016-04-14 DIAGNOSIS — E213 Hyperparathyroidism, unspecified: Secondary | ICD-10-CM | POA: Diagnosis present

## 2016-04-19 DIAGNOSIS — N186 End stage renal disease: Secondary | ICD-10-CM | POA: Diagnosis not present

## 2016-04-19 DIAGNOSIS — Z992 Dependence on renal dialysis: Secondary | ICD-10-CM | POA: Diagnosis not present

## 2016-04-20 DIAGNOSIS — D509 Iron deficiency anemia, unspecified: Secondary | ICD-10-CM | POA: Diagnosis not present

## 2016-04-20 DIAGNOSIS — D649 Anemia, unspecified: Secondary | ICD-10-CM | POA: Diagnosis not present

## 2016-04-20 DIAGNOSIS — E538 Deficiency of other specified B group vitamins: Secondary | ICD-10-CM | POA: Diagnosis not present

## 2016-04-20 DIAGNOSIS — N186 End stage renal disease: Secondary | ICD-10-CM | POA: Diagnosis not present

## 2016-04-20 DIAGNOSIS — E46 Unspecified protein-calorie malnutrition: Secondary | ICD-10-CM | POA: Diagnosis not present

## 2016-04-20 DIAGNOSIS — R748 Abnormal levels of other serum enzymes: Secondary | ICD-10-CM | POA: Diagnosis not present

## 2016-04-20 DIAGNOSIS — N2581 Secondary hyperparathyroidism of renal origin: Secondary | ICD-10-CM | POA: Diagnosis not present

## 2016-04-20 DIAGNOSIS — E875 Hyperkalemia: Secondary | ICD-10-CM | POA: Diagnosis not present

## 2016-04-20 DIAGNOSIS — D631 Anemia in chronic kidney disease: Secondary | ICD-10-CM | POA: Diagnosis not present

## 2016-04-22 DIAGNOSIS — Z79899 Other long term (current) drug therapy: Secondary | ICD-10-CM | POA: Diagnosis not present

## 2016-04-22 DIAGNOSIS — Z794 Long term (current) use of insulin: Secondary | ICD-10-CM | POA: Diagnosis not present

## 2016-04-22 DIAGNOSIS — D649 Anemia, unspecified: Secondary | ICD-10-CM | POA: Diagnosis not present

## 2016-04-22 DIAGNOSIS — Z7901 Long term (current) use of anticoagulants: Secondary | ICD-10-CM | POA: Diagnosis not present

## 2016-04-22 DIAGNOSIS — Z992 Dependence on renal dialysis: Secondary | ICD-10-CM | POA: Diagnosis not present

## 2016-04-22 DIAGNOSIS — N186 End stage renal disease: Secondary | ICD-10-CM | POA: Diagnosis not present

## 2016-04-22 DIAGNOSIS — E538 Deficiency of other specified B group vitamins: Secondary | ICD-10-CM | POA: Diagnosis not present

## 2016-04-22 DIAGNOSIS — E785 Hyperlipidemia, unspecified: Secondary | ICD-10-CM | POA: Diagnosis not present

## 2016-04-22 DIAGNOSIS — I120 Hypertensive chronic kidney disease with stage 5 chronic kidney disease or end stage renal disease: Secondary | ICD-10-CM | POA: Diagnosis not present

## 2016-04-22 DIAGNOSIS — N2581 Secondary hyperparathyroidism of renal origin: Secondary | ICD-10-CM | POA: Diagnosis not present

## 2016-04-22 DIAGNOSIS — D631 Anemia in chronic kidney disease: Secondary | ICD-10-CM | POA: Diagnosis not present

## 2016-04-22 DIAGNOSIS — E1122 Type 2 diabetes mellitus with diabetic chronic kidney disease: Secondary | ICD-10-CM | POA: Diagnosis not present

## 2016-04-23 DIAGNOSIS — Z7901 Long term (current) use of anticoagulants: Secondary | ICD-10-CM | POA: Diagnosis not present

## 2016-04-23 DIAGNOSIS — N186 End stage renal disease: Secondary | ICD-10-CM | POA: Diagnosis not present

## 2016-04-23 DIAGNOSIS — Z992 Dependence on renal dialysis: Secondary | ICD-10-CM | POA: Diagnosis not present

## 2016-04-23 DIAGNOSIS — Z794 Long term (current) use of insulin: Secondary | ICD-10-CM | POA: Diagnosis not present

## 2016-04-23 DIAGNOSIS — E1122 Type 2 diabetes mellitus with diabetic chronic kidney disease: Secondary | ICD-10-CM | POA: Diagnosis not present

## 2016-04-23 DIAGNOSIS — I120 Hypertensive chronic kidney disease with stage 5 chronic kidney disease or end stage renal disease: Secondary | ICD-10-CM | POA: Diagnosis not present

## 2016-04-24 DIAGNOSIS — D631 Anemia in chronic kidney disease: Secondary | ICD-10-CM | POA: Diagnosis not present

## 2016-04-24 DIAGNOSIS — N2581 Secondary hyperparathyroidism of renal origin: Secondary | ICD-10-CM | POA: Diagnosis not present

## 2016-04-24 DIAGNOSIS — N186 End stage renal disease: Secondary | ICD-10-CM | POA: Diagnosis not present

## 2016-04-25 DIAGNOSIS — Z992 Dependence on renal dialysis: Secondary | ICD-10-CM | POA: Diagnosis not present

## 2016-04-25 DIAGNOSIS — E1122 Type 2 diabetes mellitus with diabetic chronic kidney disease: Secondary | ICD-10-CM | POA: Diagnosis not present

## 2016-04-25 DIAGNOSIS — Z7901 Long term (current) use of anticoagulants: Secondary | ICD-10-CM | POA: Diagnosis not present

## 2016-04-25 DIAGNOSIS — I120 Hypertensive chronic kidney disease with stage 5 chronic kidney disease or end stage renal disease: Secondary | ICD-10-CM | POA: Diagnosis not present

## 2016-04-25 DIAGNOSIS — Z794 Long term (current) use of insulin: Secondary | ICD-10-CM | POA: Diagnosis not present

## 2016-04-25 DIAGNOSIS — N186 End stage renal disease: Secondary | ICD-10-CM | POA: Diagnosis not present

## 2016-04-26 DIAGNOSIS — F332 Major depressive disorder, recurrent severe without psychotic features: Secondary | ICD-10-CM | POA: Diagnosis not present

## 2016-04-26 DIAGNOSIS — N186 End stage renal disease: Secondary | ICD-10-CM | POA: Diagnosis not present

## 2016-04-26 DIAGNOSIS — N2581 Secondary hyperparathyroidism of renal origin: Secondary | ICD-10-CM | POA: Diagnosis not present

## 2016-04-26 DIAGNOSIS — D631 Anemia in chronic kidney disease: Secondary | ICD-10-CM | POA: Diagnosis not present

## 2016-04-29 DIAGNOSIS — N186 End stage renal disease: Secondary | ICD-10-CM | POA: Diagnosis not present

## 2016-04-29 DIAGNOSIS — N2581 Secondary hyperparathyroidism of renal origin: Secondary | ICD-10-CM | POA: Diagnosis not present

## 2016-04-29 DIAGNOSIS — D631 Anemia in chronic kidney disease: Secondary | ICD-10-CM | POA: Diagnosis not present

## 2016-04-30 DIAGNOSIS — Z7901 Long term (current) use of anticoagulants: Secondary | ICD-10-CM | POA: Diagnosis not present

## 2016-04-30 DIAGNOSIS — E1122 Type 2 diabetes mellitus with diabetic chronic kidney disease: Secondary | ICD-10-CM | POA: Diagnosis not present

## 2016-04-30 DIAGNOSIS — N186 End stage renal disease: Secondary | ICD-10-CM | POA: Diagnosis not present

## 2016-04-30 DIAGNOSIS — Z992 Dependence on renal dialysis: Secondary | ICD-10-CM | POA: Diagnosis not present

## 2016-04-30 DIAGNOSIS — I120 Hypertensive chronic kidney disease with stage 5 chronic kidney disease or end stage renal disease: Secondary | ICD-10-CM | POA: Diagnosis not present

## 2016-04-30 DIAGNOSIS — Z794 Long term (current) use of insulin: Secondary | ICD-10-CM | POA: Diagnosis not present

## 2016-05-01 DIAGNOSIS — N2581 Secondary hyperparathyroidism of renal origin: Secondary | ICD-10-CM | POA: Diagnosis not present

## 2016-05-01 DIAGNOSIS — D631 Anemia in chronic kidney disease: Secondary | ICD-10-CM | POA: Diagnosis not present

## 2016-05-01 DIAGNOSIS — N186 End stage renal disease: Secondary | ICD-10-CM | POA: Diagnosis not present

## 2016-05-03 DIAGNOSIS — N186 End stage renal disease: Secondary | ICD-10-CM | POA: Diagnosis not present

## 2016-05-03 DIAGNOSIS — F332 Major depressive disorder, recurrent severe without psychotic features: Secondary | ICD-10-CM | POA: Diagnosis not present

## 2016-05-03 DIAGNOSIS — D631 Anemia in chronic kidney disease: Secondary | ICD-10-CM | POA: Diagnosis not present

## 2016-05-03 DIAGNOSIS — N2581 Secondary hyperparathyroidism of renal origin: Secondary | ICD-10-CM | POA: Diagnosis not present

## 2016-05-06 DIAGNOSIS — N2581 Secondary hyperparathyroidism of renal origin: Secondary | ICD-10-CM | POA: Diagnosis not present

## 2016-05-06 DIAGNOSIS — N186 End stage renal disease: Secondary | ICD-10-CM | POA: Diagnosis not present

## 2016-05-06 DIAGNOSIS — D631 Anemia in chronic kidney disease: Secondary | ICD-10-CM | POA: Diagnosis not present

## 2016-05-07 DIAGNOSIS — N186 End stage renal disease: Secondary | ICD-10-CM | POA: Diagnosis not present

## 2016-05-07 DIAGNOSIS — I120 Hypertensive chronic kidney disease with stage 5 chronic kidney disease or end stage renal disease: Secondary | ICD-10-CM | POA: Diagnosis not present

## 2016-05-07 DIAGNOSIS — Z7901 Long term (current) use of anticoagulants: Secondary | ICD-10-CM | POA: Diagnosis not present

## 2016-05-07 DIAGNOSIS — E1122 Type 2 diabetes mellitus with diabetic chronic kidney disease: Secondary | ICD-10-CM | POA: Diagnosis not present

## 2016-05-07 DIAGNOSIS — Z992 Dependence on renal dialysis: Secondary | ICD-10-CM | POA: Diagnosis not present

## 2016-05-07 DIAGNOSIS — Z794 Long term (current) use of insulin: Secondary | ICD-10-CM | POA: Diagnosis not present

## 2016-05-08 DIAGNOSIS — E1122 Type 2 diabetes mellitus with diabetic chronic kidney disease: Secondary | ICD-10-CM | POA: Diagnosis not present

## 2016-05-08 DIAGNOSIS — Z794 Long term (current) use of insulin: Secondary | ICD-10-CM | POA: Diagnosis not present

## 2016-05-08 DIAGNOSIS — N2581 Secondary hyperparathyroidism of renal origin: Secondary | ICD-10-CM | POA: Diagnosis not present

## 2016-05-08 DIAGNOSIS — N186 End stage renal disease: Secondary | ICD-10-CM | POA: Diagnosis not present

## 2016-05-08 DIAGNOSIS — I120 Hypertensive chronic kidney disease with stage 5 chronic kidney disease or end stage renal disease: Secondary | ICD-10-CM | POA: Diagnosis not present

## 2016-05-08 DIAGNOSIS — D631 Anemia in chronic kidney disease: Secondary | ICD-10-CM | POA: Diagnosis not present

## 2016-05-08 DIAGNOSIS — Z992 Dependence on renal dialysis: Secondary | ICD-10-CM | POA: Diagnosis not present

## 2016-05-08 DIAGNOSIS — Z7901 Long term (current) use of anticoagulants: Secondary | ICD-10-CM | POA: Diagnosis not present

## 2016-05-10 DIAGNOSIS — F332 Major depressive disorder, recurrent severe without psychotic features: Secondary | ICD-10-CM | POA: Diagnosis not present

## 2016-05-10 DIAGNOSIS — D631 Anemia in chronic kidney disease: Secondary | ICD-10-CM | POA: Diagnosis not present

## 2016-05-10 DIAGNOSIS — N186 End stage renal disease: Secondary | ICD-10-CM | POA: Diagnosis not present

## 2016-05-10 DIAGNOSIS — N2581 Secondary hyperparathyroidism of renal origin: Secondary | ICD-10-CM | POA: Diagnosis not present

## 2016-05-13 DIAGNOSIS — N2581 Secondary hyperparathyroidism of renal origin: Secondary | ICD-10-CM | POA: Diagnosis not present

## 2016-05-13 DIAGNOSIS — N186 End stage renal disease: Secondary | ICD-10-CM | POA: Diagnosis not present

## 2016-05-13 DIAGNOSIS — D631 Anemia in chronic kidney disease: Secondary | ICD-10-CM | POA: Diagnosis not present

## 2016-05-14 DIAGNOSIS — Z794 Long term (current) use of insulin: Secondary | ICD-10-CM | POA: Diagnosis not present

## 2016-05-14 DIAGNOSIS — Z992 Dependence on renal dialysis: Secondary | ICD-10-CM | POA: Diagnosis not present

## 2016-05-14 DIAGNOSIS — E1122 Type 2 diabetes mellitus with diabetic chronic kidney disease: Secondary | ICD-10-CM | POA: Diagnosis not present

## 2016-05-14 DIAGNOSIS — Z7901 Long term (current) use of anticoagulants: Secondary | ICD-10-CM | POA: Diagnosis not present

## 2016-05-14 DIAGNOSIS — I120 Hypertensive chronic kidney disease with stage 5 chronic kidney disease or end stage renal disease: Secondary | ICD-10-CM | POA: Diagnosis not present

## 2016-05-14 DIAGNOSIS — N186 End stage renal disease: Secondary | ICD-10-CM | POA: Diagnosis not present

## 2016-05-15 DIAGNOSIS — R1011 Right upper quadrant pain: Secondary | ICD-10-CM | POA: Diagnosis not present

## 2016-05-15 DIAGNOSIS — E1140 Type 2 diabetes mellitus with diabetic neuropathy, unspecified: Secondary | ICD-10-CM | POA: Diagnosis not present

## 2016-05-15 DIAGNOSIS — R109 Unspecified abdominal pain: Secondary | ICD-10-CM | POA: Diagnosis not present

## 2016-05-15 DIAGNOSIS — E785 Hyperlipidemia, unspecified: Secondary | ICD-10-CM | POA: Diagnosis not present

## 2016-05-15 DIAGNOSIS — I5032 Chronic diastolic (congestive) heart failure: Secondary | ICD-10-CM | POA: Diagnosis not present

## 2016-05-15 DIAGNOSIS — K8020 Calculus of gallbladder without cholecystitis without obstruction: Secondary | ICD-10-CM | POA: Diagnosis not present

## 2016-05-15 DIAGNOSIS — R9431 Abnormal electrocardiogram [ECG] [EKG]: Secondary | ICD-10-CM | POA: Diagnosis not present

## 2016-05-15 DIAGNOSIS — I132 Hypertensive heart and chronic kidney disease with heart failure and with stage 5 chronic kidney disease, or end stage renal disease: Secondary | ICD-10-CM | POA: Diagnosis not present

## 2016-05-15 DIAGNOSIS — N189 Chronic kidney disease, unspecified: Secondary | ICD-10-CM | POA: Diagnosis not present

## 2016-05-15 DIAGNOSIS — E875 Hyperkalemia: Secondary | ICD-10-CM | POA: Diagnosis not present

## 2016-05-15 DIAGNOSIS — R1013 Epigastric pain: Secondary | ICD-10-CM | POA: Diagnosis not present

## 2016-05-15 DIAGNOSIS — R1031 Right lower quadrant pain: Secondary | ICD-10-CM | POA: Diagnosis not present

## 2016-05-15 DIAGNOSIS — K219 Gastro-esophageal reflux disease without esophagitis: Secondary | ICD-10-CM | POA: Diagnosis not present

## 2016-05-15 DIAGNOSIS — D72829 Elevated white blood cell count, unspecified: Secondary | ICD-10-CM | POA: Diagnosis not present

## 2016-05-15 DIAGNOSIS — D631 Anemia in chronic kidney disease: Secondary | ICD-10-CM | POA: Diagnosis not present

## 2016-05-15 DIAGNOSIS — N2581 Secondary hyperparathyroidism of renal origin: Secondary | ICD-10-CM | POA: Diagnosis not present

## 2016-05-15 DIAGNOSIS — Z992 Dependence on renal dialysis: Secondary | ICD-10-CM | POA: Diagnosis not present

## 2016-05-15 DIAGNOSIS — E279 Disorder of adrenal gland, unspecified: Secondary | ICD-10-CM | POA: Diagnosis not present

## 2016-05-15 DIAGNOSIS — N186 End stage renal disease: Secondary | ICD-10-CM | POA: Diagnosis not present

## 2016-05-15 DIAGNOSIS — K5900 Constipation, unspecified: Secondary | ICD-10-CM | POA: Diagnosis not present

## 2016-05-15 DIAGNOSIS — E1165 Type 2 diabetes mellitus with hyperglycemia: Secondary | ICD-10-CM | POA: Diagnosis not present

## 2016-05-15 DIAGNOSIS — G4733 Obstructive sleep apnea (adult) (pediatric): Secondary | ICD-10-CM | POA: Diagnosis not present

## 2016-05-15 DIAGNOSIS — I440 Atrioventricular block, first degree: Secondary | ICD-10-CM | POA: Diagnosis not present

## 2016-05-16 DIAGNOSIS — R109 Unspecified abdominal pain: Secondary | ICD-10-CM | POA: Diagnosis not present

## 2016-05-16 DIAGNOSIS — E1165 Type 2 diabetes mellitus with hyperglycemia: Secondary | ICD-10-CM | POA: Diagnosis not present

## 2016-05-16 DIAGNOSIS — E875 Hyperkalemia: Secondary | ICD-10-CM | POA: Diagnosis not present

## 2016-05-16 DIAGNOSIS — K8020 Calculus of gallbladder without cholecystitis without obstruction: Secondary | ICD-10-CM | POA: Diagnosis not present

## 2016-05-16 DIAGNOSIS — N189 Chronic kidney disease, unspecified: Secondary | ICD-10-CM | POA: Diagnosis not present

## 2016-05-16 DIAGNOSIS — N186 End stage renal disease: Secondary | ICD-10-CM | POA: Diagnosis not present

## 2016-05-17 DIAGNOSIS — E785 Hyperlipidemia, unspecified: Secondary | ICD-10-CM | POA: Diagnosis not present

## 2016-05-17 DIAGNOSIS — N186 End stage renal disease: Secondary | ICD-10-CM | POA: Diagnosis not present

## 2016-05-17 DIAGNOSIS — R609 Edema, unspecified: Secondary | ICD-10-CM | POA: Diagnosis not present

## 2016-05-17 DIAGNOSIS — I120 Hypertensive chronic kidney disease with stage 5 chronic kidney disease or end stage renal disease: Secondary | ICD-10-CM | POA: Diagnosis not present

## 2016-05-17 DIAGNOSIS — I5032 Chronic diastolic (congestive) heart failure: Secondary | ICD-10-CM | POA: Diagnosis not present

## 2016-05-17 DIAGNOSIS — E46 Unspecified protein-calorie malnutrition: Secondary | ICD-10-CM | POA: Diagnosis not present

## 2016-05-17 DIAGNOSIS — G4733 Obstructive sleep apnea (adult) (pediatric): Secondary | ICD-10-CM | POA: Diagnosis not present

## 2016-05-17 DIAGNOSIS — R1013 Epigastric pain: Secondary | ICD-10-CM | POA: Diagnosis not present

## 2016-05-17 DIAGNOSIS — N200 Calculus of kidney: Secondary | ICD-10-CM | POA: Diagnosis not present

## 2016-05-17 DIAGNOSIS — M6281 Muscle weakness (generalized): Secondary | ICD-10-CM | POA: Diagnosis not present

## 2016-05-17 DIAGNOSIS — K219 Gastro-esophageal reflux disease without esophagitis: Secondary | ICD-10-CM | POA: Diagnosis not present

## 2016-05-17 DIAGNOSIS — R262 Difficulty in walking, not elsewhere classified: Secondary | ICD-10-CM | POA: Diagnosis not present

## 2016-05-17 DIAGNOSIS — E538 Deficiency of other specified B group vitamins: Secondary | ICD-10-CM | POA: Diagnosis not present

## 2016-05-17 DIAGNOSIS — R160 Hepatomegaly, not elsewhere classified: Secondary | ICD-10-CM | POA: Diagnosis not present

## 2016-05-17 DIAGNOSIS — I132 Hypertensive heart and chronic kidney disease with heart failure and with stage 5 chronic kidney disease, or end stage renal disease: Secondary | ICD-10-CM | POA: Diagnosis not present

## 2016-05-17 DIAGNOSIS — K5900 Constipation, unspecified: Secondary | ICD-10-CM | POA: Diagnosis not present

## 2016-05-17 DIAGNOSIS — D631 Anemia in chronic kidney disease: Secondary | ICD-10-CM | POA: Diagnosis not present

## 2016-05-17 DIAGNOSIS — M791 Myalgia: Secondary | ICD-10-CM | POA: Diagnosis not present

## 2016-05-17 DIAGNOSIS — D509 Iron deficiency anemia, unspecified: Secondary | ICD-10-CM | POA: Diagnosis not present

## 2016-05-17 DIAGNOSIS — N2581 Secondary hyperparathyroidism of renal origin: Secondary | ICD-10-CM | POA: Diagnosis not present

## 2016-05-17 DIAGNOSIS — G8929 Other chronic pain: Secondary | ICD-10-CM | POA: Diagnosis not present

## 2016-05-17 DIAGNOSIS — E1165 Type 2 diabetes mellitus with hyperglycemia: Secondary | ICD-10-CM | POA: Diagnosis not present

## 2016-05-17 DIAGNOSIS — D3502 Benign neoplasm of left adrenal gland: Secondary | ICD-10-CM | POA: Diagnosis not present

## 2016-05-17 DIAGNOSIS — R109 Unspecified abdominal pain: Secondary | ICD-10-CM | POA: Diagnosis not present

## 2016-05-17 DIAGNOSIS — K8000 Calculus of gallbladder with acute cholecystitis without obstruction: Secondary | ICD-10-CM | POA: Diagnosis not present

## 2016-05-17 DIAGNOSIS — M1000 Idiopathic gout, unspecified site: Secondary | ICD-10-CM | POA: Diagnosis not present

## 2016-05-17 DIAGNOSIS — F332 Major depressive disorder, recurrent severe without psychotic features: Secondary | ICD-10-CM | POA: Diagnosis not present

## 2016-05-17 DIAGNOSIS — I872 Venous insufficiency (chronic) (peripheral): Secondary | ICD-10-CM | POA: Diagnosis not present

## 2016-05-17 DIAGNOSIS — Z79899 Other long term (current) drug therapy: Secondary | ICD-10-CM | POA: Diagnosis not present

## 2016-05-17 DIAGNOSIS — Z992 Dependence on renal dialysis: Secondary | ICD-10-CM | POA: Diagnosis not present

## 2016-05-17 DIAGNOSIS — M62838 Other muscle spasm: Secondary | ICD-10-CM | POA: Diagnosis not present

## 2016-05-17 DIAGNOSIS — K8020 Calculus of gallbladder without cholecystitis without obstruction: Secondary | ICD-10-CM | POA: Diagnosis not present

## 2016-05-17 DIAGNOSIS — D649 Anemia, unspecified: Secondary | ICD-10-CM | POA: Diagnosis not present

## 2016-05-17 DIAGNOSIS — E875 Hyperkalemia: Secondary | ICD-10-CM | POA: Diagnosis not present

## 2016-05-17 DIAGNOSIS — E1140 Type 2 diabetes mellitus with diabetic neuropathy, unspecified: Secondary | ICD-10-CM | POA: Diagnosis not present

## 2016-05-17 DIAGNOSIS — E559 Vitamin D deficiency, unspecified: Secondary | ICD-10-CM | POA: Diagnosis not present

## 2016-05-17 DIAGNOSIS — R748 Abnormal levels of other serum enzymes: Secondary | ICD-10-CM | POA: Diagnosis not present

## 2016-05-17 DIAGNOSIS — R9431 Abnormal electrocardiogram [ECG] [EKG]: Secondary | ICD-10-CM | POA: Diagnosis not present

## 2016-05-17 DIAGNOSIS — M109 Gout, unspecified: Secondary | ICD-10-CM | POA: Diagnosis not present

## 2016-05-17 DIAGNOSIS — R531 Weakness: Secondary | ICD-10-CM | POA: Diagnosis not present

## 2016-05-20 DIAGNOSIS — N186 End stage renal disease: Secondary | ICD-10-CM | POA: Diagnosis not present

## 2016-05-20 DIAGNOSIS — D631 Anemia in chronic kidney disease: Secondary | ICD-10-CM | POA: Diagnosis not present

## 2016-05-20 DIAGNOSIS — N2581 Secondary hyperparathyroidism of renal origin: Secondary | ICD-10-CM | POA: Diagnosis not present

## 2016-05-22 DIAGNOSIS — E538 Deficiency of other specified B group vitamins: Secondary | ICD-10-CM | POA: Diagnosis not present

## 2016-05-22 DIAGNOSIS — N2581 Secondary hyperparathyroidism of renal origin: Secondary | ICD-10-CM | POA: Diagnosis not present

## 2016-05-22 DIAGNOSIS — D631 Anemia in chronic kidney disease: Secondary | ICD-10-CM | POA: Diagnosis not present

## 2016-05-22 DIAGNOSIS — N186 End stage renal disease: Secondary | ICD-10-CM | POA: Diagnosis not present

## 2016-05-22 DIAGNOSIS — D649 Anemia, unspecified: Secondary | ICD-10-CM | POA: Diagnosis not present

## 2016-05-24 DIAGNOSIS — D649 Anemia, unspecified: Secondary | ICD-10-CM | POA: Diagnosis not present

## 2016-05-24 DIAGNOSIS — D631 Anemia in chronic kidney disease: Secondary | ICD-10-CM | POA: Diagnosis not present

## 2016-05-24 DIAGNOSIS — N186 End stage renal disease: Secondary | ICD-10-CM | POA: Diagnosis not present

## 2016-05-24 DIAGNOSIS — E538 Deficiency of other specified B group vitamins: Secondary | ICD-10-CM | POA: Diagnosis not present

## 2016-05-24 DIAGNOSIS — N2581 Secondary hyperparathyroidism of renal origin: Secondary | ICD-10-CM | POA: Diagnosis not present

## 2016-05-27 DIAGNOSIS — N2581 Secondary hyperparathyroidism of renal origin: Secondary | ICD-10-CM | POA: Diagnosis not present

## 2016-05-27 DIAGNOSIS — N186 End stage renal disease: Secondary | ICD-10-CM | POA: Diagnosis not present

## 2016-05-27 DIAGNOSIS — D649 Anemia, unspecified: Secondary | ICD-10-CM | POA: Diagnosis not present

## 2016-05-27 DIAGNOSIS — D631 Anemia in chronic kidney disease: Secondary | ICD-10-CM | POA: Diagnosis not present

## 2016-05-27 DIAGNOSIS — E538 Deficiency of other specified B group vitamins: Secondary | ICD-10-CM | POA: Diagnosis not present

## 2016-05-29 DIAGNOSIS — D631 Anemia in chronic kidney disease: Secondary | ICD-10-CM | POA: Diagnosis not present

## 2016-05-29 DIAGNOSIS — N186 End stage renal disease: Secondary | ICD-10-CM | POA: Diagnosis not present

## 2016-05-29 DIAGNOSIS — N2581 Secondary hyperparathyroidism of renal origin: Secondary | ICD-10-CM | POA: Diagnosis not present

## 2016-05-29 DIAGNOSIS — E538 Deficiency of other specified B group vitamins: Secondary | ICD-10-CM | POA: Diagnosis not present

## 2016-05-29 DIAGNOSIS — D649 Anemia, unspecified: Secondary | ICD-10-CM | POA: Diagnosis not present

## 2016-05-31 DIAGNOSIS — D631 Anemia in chronic kidney disease: Secondary | ICD-10-CM | POA: Diagnosis not present

## 2016-05-31 DIAGNOSIS — N186 End stage renal disease: Secondary | ICD-10-CM | POA: Diagnosis not present

## 2016-05-31 DIAGNOSIS — E538 Deficiency of other specified B group vitamins: Secondary | ICD-10-CM | POA: Diagnosis not present

## 2016-05-31 DIAGNOSIS — D649 Anemia, unspecified: Secondary | ICD-10-CM | POA: Diagnosis not present

## 2016-05-31 DIAGNOSIS — N2581 Secondary hyperparathyroidism of renal origin: Secondary | ICD-10-CM | POA: Diagnosis not present

## 2016-06-02 DIAGNOSIS — F332 Major depressive disorder, recurrent severe without psychotic features: Secondary | ICD-10-CM | POA: Diagnosis not present

## 2016-06-03 DIAGNOSIS — D649 Anemia, unspecified: Secondary | ICD-10-CM | POA: Diagnosis not present

## 2016-06-03 DIAGNOSIS — N2581 Secondary hyperparathyroidism of renal origin: Secondary | ICD-10-CM | POA: Diagnosis not present

## 2016-06-03 DIAGNOSIS — D631 Anemia in chronic kidney disease: Secondary | ICD-10-CM | POA: Diagnosis not present

## 2016-06-03 DIAGNOSIS — E538 Deficiency of other specified B group vitamins: Secondary | ICD-10-CM | POA: Diagnosis not present

## 2016-06-03 DIAGNOSIS — N186 End stage renal disease: Secondary | ICD-10-CM | POA: Diagnosis not present

## 2016-06-05 DIAGNOSIS — N186 End stage renal disease: Secondary | ICD-10-CM | POA: Diagnosis not present

## 2016-06-05 DIAGNOSIS — D631 Anemia in chronic kidney disease: Secondary | ICD-10-CM | POA: Diagnosis not present

## 2016-06-05 DIAGNOSIS — N2581 Secondary hyperparathyroidism of renal origin: Secondary | ICD-10-CM | POA: Diagnosis not present

## 2016-06-07 DIAGNOSIS — F332 Major depressive disorder, recurrent severe without psychotic features: Secondary | ICD-10-CM | POA: Diagnosis not present

## 2016-06-07 DIAGNOSIS — N2581 Secondary hyperparathyroidism of renal origin: Secondary | ICD-10-CM | POA: Diagnosis not present

## 2016-06-07 DIAGNOSIS — N186 End stage renal disease: Secondary | ICD-10-CM | POA: Diagnosis not present

## 2016-06-07 DIAGNOSIS — D631 Anemia in chronic kidney disease: Secondary | ICD-10-CM | POA: Diagnosis not present

## 2016-06-08 DIAGNOSIS — K5909 Other constipation: Secondary | ICD-10-CM | POA: Diagnosis not present

## 2016-06-08 DIAGNOSIS — E119 Type 2 diabetes mellitus without complications: Secondary | ICD-10-CM | POA: Diagnosis not present

## 2016-06-08 DIAGNOSIS — I10 Essential (primary) hypertension: Secondary | ICD-10-CM | POA: Diagnosis not present

## 2016-06-08 DIAGNOSIS — K8000 Calculus of gallbladder with acute cholecystitis without obstruction: Secondary | ICD-10-CM | POA: Diagnosis not present

## 2016-06-10 DIAGNOSIS — N186 End stage renal disease: Secondary | ICD-10-CM | POA: Diagnosis not present

## 2016-06-10 DIAGNOSIS — N2581 Secondary hyperparathyroidism of renal origin: Secondary | ICD-10-CM | POA: Diagnosis not present

## 2016-06-10 DIAGNOSIS — D631 Anemia in chronic kidney disease: Secondary | ICD-10-CM | POA: Diagnosis not present

## 2016-06-11 DIAGNOSIS — I10 Essential (primary) hypertension: Secondary | ICD-10-CM | POA: Diagnosis not present

## 2016-06-11 DIAGNOSIS — K8000 Calculus of gallbladder with acute cholecystitis without obstruction: Secondary | ICD-10-CM | POA: Diagnosis not present

## 2016-06-11 DIAGNOSIS — E119 Type 2 diabetes mellitus without complications: Secondary | ICD-10-CM | POA: Diagnosis not present

## 2016-06-11 DIAGNOSIS — K5909 Other constipation: Secondary | ICD-10-CM | POA: Diagnosis not present

## 2016-06-12 DIAGNOSIS — D631 Anemia in chronic kidney disease: Secondary | ICD-10-CM | POA: Diagnosis not present

## 2016-06-12 DIAGNOSIS — N2581 Secondary hyperparathyroidism of renal origin: Secondary | ICD-10-CM | POA: Diagnosis not present

## 2016-06-12 DIAGNOSIS — N186 End stage renal disease: Secondary | ICD-10-CM | POA: Diagnosis not present

## 2016-06-13 DIAGNOSIS — I10 Essential (primary) hypertension: Secondary | ICD-10-CM | POA: Diagnosis not present

## 2016-06-13 DIAGNOSIS — K5909 Other constipation: Secondary | ICD-10-CM | POA: Diagnosis not present

## 2016-06-13 DIAGNOSIS — E119 Type 2 diabetes mellitus without complications: Secondary | ICD-10-CM | POA: Diagnosis not present

## 2016-06-13 DIAGNOSIS — K8000 Calculus of gallbladder with acute cholecystitis without obstruction: Secondary | ICD-10-CM | POA: Diagnosis not present

## 2016-06-14 DIAGNOSIS — E119 Type 2 diabetes mellitus without complications: Secondary | ICD-10-CM | POA: Diagnosis not present

## 2016-06-14 DIAGNOSIS — K5909 Other constipation: Secondary | ICD-10-CM | POA: Diagnosis not present

## 2016-06-14 DIAGNOSIS — N2581 Secondary hyperparathyroidism of renal origin: Secondary | ICD-10-CM | POA: Diagnosis not present

## 2016-06-14 DIAGNOSIS — K8000 Calculus of gallbladder with acute cholecystitis without obstruction: Secondary | ICD-10-CM | POA: Diagnosis not present

## 2016-06-14 DIAGNOSIS — I10 Essential (primary) hypertension: Secondary | ICD-10-CM | POA: Diagnosis not present

## 2016-06-14 DIAGNOSIS — D631 Anemia in chronic kidney disease: Secondary | ICD-10-CM | POA: Diagnosis not present

## 2016-06-14 DIAGNOSIS — F332 Major depressive disorder, recurrent severe without psychotic features: Secondary | ICD-10-CM | POA: Diagnosis not present

## 2016-06-14 DIAGNOSIS — N186 End stage renal disease: Secondary | ICD-10-CM | POA: Diagnosis not present

## 2016-06-15 DIAGNOSIS — E119 Type 2 diabetes mellitus without complications: Secondary | ICD-10-CM | POA: Diagnosis not present

## 2016-06-15 DIAGNOSIS — I10 Essential (primary) hypertension: Secondary | ICD-10-CM | POA: Diagnosis not present

## 2016-06-15 DIAGNOSIS — K5909 Other constipation: Secondary | ICD-10-CM | POA: Diagnosis not present

## 2016-06-15 DIAGNOSIS — K8000 Calculus of gallbladder with acute cholecystitis without obstruction: Secondary | ICD-10-CM | POA: Diagnosis not present

## 2016-06-17 DIAGNOSIS — E875 Hyperkalemia: Secondary | ICD-10-CM | POA: Diagnosis not present

## 2016-06-17 DIAGNOSIS — N186 End stage renal disease: Secondary | ICD-10-CM | POA: Diagnosis not present

## 2016-06-17 DIAGNOSIS — E46 Unspecified protein-calorie malnutrition: Secondary | ICD-10-CM | POA: Diagnosis not present

## 2016-06-17 DIAGNOSIS — R748 Abnormal levels of other serum enzymes: Secondary | ICD-10-CM | POA: Diagnosis not present

## 2016-06-17 DIAGNOSIS — D509 Iron deficiency anemia, unspecified: Secondary | ICD-10-CM | POA: Diagnosis not present

## 2016-06-17 DIAGNOSIS — N2581 Secondary hyperparathyroidism of renal origin: Secondary | ICD-10-CM | POA: Diagnosis not present

## 2016-06-17 DIAGNOSIS — E538 Deficiency of other specified B group vitamins: Secondary | ICD-10-CM | POA: Diagnosis not present

## 2016-06-17 DIAGNOSIS — D649 Anemia, unspecified: Secondary | ICD-10-CM | POA: Diagnosis not present

## 2016-06-17 DIAGNOSIS — D631 Anemia in chronic kidney disease: Secondary | ICD-10-CM | POA: Diagnosis not present

## 2016-06-18 DIAGNOSIS — K5909 Other constipation: Secondary | ICD-10-CM | POA: Diagnosis not present

## 2016-06-18 DIAGNOSIS — K8000 Calculus of gallbladder with acute cholecystitis without obstruction: Secondary | ICD-10-CM | POA: Diagnosis not present

## 2016-06-18 DIAGNOSIS — I10 Essential (primary) hypertension: Secondary | ICD-10-CM | POA: Diagnosis not present

## 2016-06-18 DIAGNOSIS — E119 Type 2 diabetes mellitus without complications: Secondary | ICD-10-CM | POA: Diagnosis not present

## 2016-06-19 DIAGNOSIS — E119 Type 2 diabetes mellitus without complications: Secondary | ICD-10-CM | POA: Diagnosis not present

## 2016-06-19 DIAGNOSIS — N2581 Secondary hyperparathyroidism of renal origin: Secondary | ICD-10-CM | POA: Diagnosis not present

## 2016-06-19 DIAGNOSIS — E785 Hyperlipidemia, unspecified: Secondary | ICD-10-CM | POA: Diagnosis not present

## 2016-06-19 DIAGNOSIS — K5909 Other constipation: Secondary | ICD-10-CM | POA: Diagnosis not present

## 2016-06-19 DIAGNOSIS — R7989 Other specified abnormal findings of blood chemistry: Secondary | ICD-10-CM | POA: Diagnosis not present

## 2016-06-19 DIAGNOSIS — D631 Anemia in chronic kidney disease: Secondary | ICD-10-CM | POA: Diagnosis not present

## 2016-06-19 DIAGNOSIS — N186 End stage renal disease: Secondary | ICD-10-CM | POA: Diagnosis not present

## 2016-06-19 DIAGNOSIS — K8000 Calculus of gallbladder with acute cholecystitis without obstruction: Secondary | ICD-10-CM | POA: Diagnosis not present

## 2016-06-19 DIAGNOSIS — E538 Deficiency of other specified B group vitamins: Secondary | ICD-10-CM | POA: Diagnosis not present

## 2016-06-19 DIAGNOSIS — I10 Essential (primary) hypertension: Secondary | ICD-10-CM | POA: Diagnosis not present

## 2016-06-19 DIAGNOSIS — Z789 Other specified health status: Secondary | ICD-10-CM | POA: Diagnosis not present

## 2016-06-19 DIAGNOSIS — D649 Anemia, unspecified: Secondary | ICD-10-CM | POA: Diagnosis not present

## 2016-06-19 DIAGNOSIS — E559 Vitamin D deficiency, unspecified: Secondary | ICD-10-CM | POA: Diagnosis not present

## 2016-06-19 DIAGNOSIS — Z79899 Other long term (current) drug therapy: Secondary | ICD-10-CM | POA: Diagnosis not present

## 2016-06-20 DIAGNOSIS — D631 Anemia in chronic kidney disease: Secondary | ICD-10-CM | POA: Diagnosis not present

## 2016-06-20 DIAGNOSIS — I10 Essential (primary) hypertension: Secondary | ICD-10-CM | POA: Diagnosis not present

## 2016-06-20 DIAGNOSIS — N185 Chronic kidney disease, stage 5: Secondary | ICD-10-CM | POA: Diagnosis not present

## 2016-06-20 DIAGNOSIS — I770 Arteriovenous fistula, acquired: Secondary | ICD-10-CM | POA: Diagnosis not present

## 2016-06-20 DIAGNOSIS — K8000 Calculus of gallbladder with acute cholecystitis without obstruction: Secondary | ICD-10-CM | POA: Diagnosis not present

## 2016-06-20 DIAGNOSIS — E119 Type 2 diabetes mellitus without complications: Secondary | ICD-10-CM | POA: Diagnosis not present

## 2016-06-20 DIAGNOSIS — K5909 Other constipation: Secondary | ICD-10-CM | POA: Diagnosis not present

## 2016-06-21 DIAGNOSIS — I10 Essential (primary) hypertension: Secondary | ICD-10-CM | POA: Diagnosis not present

## 2016-06-21 DIAGNOSIS — F332 Major depressive disorder, recurrent severe without psychotic features: Secondary | ICD-10-CM | POA: Diagnosis not present

## 2016-06-21 DIAGNOSIS — D631 Anemia in chronic kidney disease: Secondary | ICD-10-CM | POA: Diagnosis not present

## 2016-06-21 DIAGNOSIS — N186 End stage renal disease: Secondary | ICD-10-CM | POA: Diagnosis not present

## 2016-06-21 DIAGNOSIS — K8000 Calculus of gallbladder with acute cholecystitis without obstruction: Secondary | ICD-10-CM | POA: Diagnosis not present

## 2016-06-21 DIAGNOSIS — E119 Type 2 diabetes mellitus without complications: Secondary | ICD-10-CM | POA: Diagnosis not present

## 2016-06-21 DIAGNOSIS — E538 Deficiency of other specified B group vitamins: Secondary | ICD-10-CM | POA: Diagnosis not present

## 2016-06-21 DIAGNOSIS — K5909 Other constipation: Secondary | ICD-10-CM | POA: Diagnosis not present

## 2016-06-21 DIAGNOSIS — N2581 Secondary hyperparathyroidism of renal origin: Secondary | ICD-10-CM | POA: Diagnosis not present

## 2016-06-21 DIAGNOSIS — D649 Anemia, unspecified: Secondary | ICD-10-CM | POA: Diagnosis not present

## 2016-06-22 DIAGNOSIS — K8000 Calculus of gallbladder with acute cholecystitis without obstruction: Secondary | ICD-10-CM | POA: Diagnosis not present

## 2016-06-22 DIAGNOSIS — K5909 Other constipation: Secondary | ICD-10-CM | POA: Diagnosis not present

## 2016-06-22 DIAGNOSIS — E119 Type 2 diabetes mellitus without complications: Secondary | ICD-10-CM | POA: Diagnosis not present

## 2016-06-22 DIAGNOSIS — I10 Essential (primary) hypertension: Secondary | ICD-10-CM | POA: Diagnosis not present

## 2016-06-24 DIAGNOSIS — N2581 Secondary hyperparathyroidism of renal origin: Secondary | ICD-10-CM | POA: Diagnosis not present

## 2016-06-24 DIAGNOSIS — N185 Chronic kidney disease, stage 5: Secondary | ICD-10-CM | POA: Diagnosis not present

## 2016-06-24 DIAGNOSIS — I10 Essential (primary) hypertension: Secondary | ICD-10-CM | POA: Diagnosis not present

## 2016-06-24 DIAGNOSIS — E538 Deficiency of other specified B group vitamins: Secondary | ICD-10-CM | POA: Diagnosis not present

## 2016-06-24 DIAGNOSIS — N186 End stage renal disease: Secondary | ICD-10-CM | POA: Diagnosis not present

## 2016-06-24 DIAGNOSIS — D649 Anemia, unspecified: Secondary | ICD-10-CM | POA: Diagnosis not present

## 2016-06-24 DIAGNOSIS — D631 Anemia in chronic kidney disease: Secondary | ICD-10-CM | POA: Diagnosis not present

## 2016-06-24 DIAGNOSIS — E785 Hyperlipidemia, unspecified: Secondary | ICD-10-CM | POA: Diagnosis not present

## 2016-06-25 DIAGNOSIS — K5909 Other constipation: Secondary | ICD-10-CM | POA: Diagnosis not present

## 2016-06-25 DIAGNOSIS — E119 Type 2 diabetes mellitus without complications: Secondary | ICD-10-CM | POA: Diagnosis not present

## 2016-06-25 DIAGNOSIS — K8000 Calculus of gallbladder with acute cholecystitis without obstruction: Secondary | ICD-10-CM | POA: Diagnosis not present

## 2016-06-25 DIAGNOSIS — I10 Essential (primary) hypertension: Secondary | ICD-10-CM | POA: Diagnosis not present

## 2016-06-26 DIAGNOSIS — N2581 Secondary hyperparathyroidism of renal origin: Secondary | ICD-10-CM | POA: Diagnosis not present

## 2016-06-26 DIAGNOSIS — D649 Anemia, unspecified: Secondary | ICD-10-CM | POA: Diagnosis not present

## 2016-06-26 DIAGNOSIS — D631 Anemia in chronic kidney disease: Secondary | ICD-10-CM | POA: Diagnosis not present

## 2016-06-26 DIAGNOSIS — E538 Deficiency of other specified B group vitamins: Secondary | ICD-10-CM | POA: Diagnosis not present

## 2016-06-26 DIAGNOSIS — N186 End stage renal disease: Secondary | ICD-10-CM | POA: Diagnosis not present

## 2016-06-27 DIAGNOSIS — I10 Essential (primary) hypertension: Secondary | ICD-10-CM | POA: Diagnosis not present

## 2016-06-27 DIAGNOSIS — K5909 Other constipation: Secondary | ICD-10-CM | POA: Diagnosis not present

## 2016-06-27 DIAGNOSIS — E119 Type 2 diabetes mellitus without complications: Secondary | ICD-10-CM | POA: Diagnosis not present

## 2016-06-27 DIAGNOSIS — K8000 Calculus of gallbladder with acute cholecystitis without obstruction: Secondary | ICD-10-CM | POA: Diagnosis not present

## 2016-06-28 DIAGNOSIS — F332 Major depressive disorder, recurrent severe without psychotic features: Secondary | ICD-10-CM | POA: Diagnosis not present

## 2016-06-28 DIAGNOSIS — D649 Anemia, unspecified: Secondary | ICD-10-CM | POA: Diagnosis not present

## 2016-06-28 DIAGNOSIS — N2581 Secondary hyperparathyroidism of renal origin: Secondary | ICD-10-CM | POA: Diagnosis not present

## 2016-06-28 DIAGNOSIS — E538 Deficiency of other specified B group vitamins: Secondary | ICD-10-CM | POA: Diagnosis not present

## 2016-06-28 DIAGNOSIS — D631 Anemia in chronic kidney disease: Secondary | ICD-10-CM | POA: Diagnosis not present

## 2016-06-28 DIAGNOSIS — N186 End stage renal disease: Secondary | ICD-10-CM | POA: Diagnosis not present

## 2016-06-29 DIAGNOSIS — I10 Essential (primary) hypertension: Secondary | ICD-10-CM | POA: Diagnosis not present

## 2016-06-29 DIAGNOSIS — K8000 Calculus of gallbladder with acute cholecystitis without obstruction: Secondary | ICD-10-CM | POA: Diagnosis not present

## 2016-06-29 DIAGNOSIS — E119 Type 2 diabetes mellitus without complications: Secondary | ICD-10-CM | POA: Diagnosis not present

## 2016-06-29 DIAGNOSIS — K5909 Other constipation: Secondary | ICD-10-CM | POA: Diagnosis not present

## 2016-07-01 DIAGNOSIS — D631 Anemia in chronic kidney disease: Secondary | ICD-10-CM | POA: Diagnosis not present

## 2016-07-01 DIAGNOSIS — E538 Deficiency of other specified B group vitamins: Secondary | ICD-10-CM | POA: Diagnosis not present

## 2016-07-01 DIAGNOSIS — N186 End stage renal disease: Secondary | ICD-10-CM | POA: Diagnosis not present

## 2016-07-01 DIAGNOSIS — N2581 Secondary hyperparathyroidism of renal origin: Secondary | ICD-10-CM | POA: Diagnosis not present

## 2016-07-01 DIAGNOSIS — D649 Anemia, unspecified: Secondary | ICD-10-CM | POA: Diagnosis not present

## 2016-07-02 DIAGNOSIS — T82590A Other mechanical complication of surgically created arteriovenous fistula, initial encounter: Secondary | ICD-10-CM | POA: Diagnosis not present

## 2016-07-03 DIAGNOSIS — N186 End stage renal disease: Secondary | ICD-10-CM | POA: Diagnosis not present

## 2016-07-03 DIAGNOSIS — K5909 Other constipation: Secondary | ICD-10-CM | POA: Diagnosis not present

## 2016-07-03 DIAGNOSIS — I10 Essential (primary) hypertension: Secondary | ICD-10-CM | POA: Diagnosis not present

## 2016-07-03 DIAGNOSIS — K8000 Calculus of gallbladder with acute cholecystitis without obstruction: Secondary | ICD-10-CM | POA: Diagnosis not present

## 2016-07-03 DIAGNOSIS — D631 Anemia in chronic kidney disease: Secondary | ICD-10-CM | POA: Diagnosis not present

## 2016-07-03 DIAGNOSIS — E119 Type 2 diabetes mellitus without complications: Secondary | ICD-10-CM | POA: Diagnosis not present

## 2016-07-04 DIAGNOSIS — E119 Type 2 diabetes mellitus without complications: Secondary | ICD-10-CM | POA: Diagnosis not present

## 2016-07-04 DIAGNOSIS — I10 Essential (primary) hypertension: Secondary | ICD-10-CM | POA: Diagnosis not present

## 2016-07-04 DIAGNOSIS — K8000 Calculus of gallbladder with acute cholecystitis without obstruction: Secondary | ICD-10-CM | POA: Diagnosis not present

## 2016-07-04 DIAGNOSIS — K5909 Other constipation: Secondary | ICD-10-CM | POA: Diagnosis not present

## 2016-07-05 DIAGNOSIS — N186 End stage renal disease: Secondary | ICD-10-CM | POA: Diagnosis not present

## 2016-07-05 DIAGNOSIS — I10 Essential (primary) hypertension: Secondary | ICD-10-CM | POA: Diagnosis not present

## 2016-07-05 DIAGNOSIS — K5909 Other constipation: Secondary | ICD-10-CM | POA: Diagnosis not present

## 2016-07-05 DIAGNOSIS — D631 Anemia in chronic kidney disease: Secondary | ICD-10-CM | POA: Diagnosis not present

## 2016-07-05 DIAGNOSIS — E119 Type 2 diabetes mellitus without complications: Secondary | ICD-10-CM | POA: Diagnosis not present

## 2016-07-05 DIAGNOSIS — F332 Major depressive disorder, recurrent severe without psychotic features: Secondary | ICD-10-CM | POA: Diagnosis not present

## 2016-07-05 DIAGNOSIS — K8000 Calculus of gallbladder with acute cholecystitis without obstruction: Secondary | ICD-10-CM | POA: Diagnosis not present

## 2016-07-08 DIAGNOSIS — I10 Essential (primary) hypertension: Secondary | ICD-10-CM | POA: Diagnosis not present

## 2016-07-08 DIAGNOSIS — E119 Type 2 diabetes mellitus without complications: Secondary | ICD-10-CM | POA: Diagnosis not present

## 2016-07-08 DIAGNOSIS — D631 Anemia in chronic kidney disease: Secondary | ICD-10-CM | POA: Diagnosis not present

## 2016-07-08 DIAGNOSIS — K8000 Calculus of gallbladder with acute cholecystitis without obstruction: Secondary | ICD-10-CM | POA: Diagnosis not present

## 2016-07-08 DIAGNOSIS — K5909 Other constipation: Secondary | ICD-10-CM | POA: Diagnosis not present

## 2016-07-08 DIAGNOSIS — N186 End stage renal disease: Secondary | ICD-10-CM | POA: Diagnosis not present

## 2016-07-10 DIAGNOSIS — N186 End stage renal disease: Secondary | ICD-10-CM | POA: Diagnosis not present

## 2016-07-10 DIAGNOSIS — D631 Anemia in chronic kidney disease: Secondary | ICD-10-CM | POA: Diagnosis not present

## 2016-07-11 DIAGNOSIS — K5909 Other constipation: Secondary | ICD-10-CM | POA: Diagnosis not present

## 2016-07-11 DIAGNOSIS — E119 Type 2 diabetes mellitus without complications: Secondary | ICD-10-CM | POA: Diagnosis not present

## 2016-07-11 DIAGNOSIS — K8000 Calculus of gallbladder with acute cholecystitis without obstruction: Secondary | ICD-10-CM | POA: Diagnosis not present

## 2016-07-11 DIAGNOSIS — I10 Essential (primary) hypertension: Secondary | ICD-10-CM | POA: Diagnosis not present

## 2016-07-12 DIAGNOSIS — F332 Major depressive disorder, recurrent severe without psychotic features: Secondary | ICD-10-CM | POA: Diagnosis not present

## 2016-07-12 DIAGNOSIS — D631 Anemia in chronic kidney disease: Secondary | ICD-10-CM | POA: Diagnosis not present

## 2016-07-12 DIAGNOSIS — N186 End stage renal disease: Secondary | ICD-10-CM | POA: Diagnosis not present

## 2016-07-13 DIAGNOSIS — K5909 Other constipation: Secondary | ICD-10-CM | POA: Diagnosis not present

## 2016-07-13 DIAGNOSIS — I10 Essential (primary) hypertension: Secondary | ICD-10-CM | POA: Diagnosis not present

## 2016-07-13 DIAGNOSIS — K8000 Calculus of gallbladder with acute cholecystitis without obstruction: Secondary | ICD-10-CM | POA: Diagnosis not present

## 2016-07-13 DIAGNOSIS — E119 Type 2 diabetes mellitus without complications: Secondary | ICD-10-CM | POA: Diagnosis not present

## 2016-07-15 DIAGNOSIS — D631 Anemia in chronic kidney disease: Secondary | ICD-10-CM | POA: Diagnosis not present

## 2016-07-15 DIAGNOSIS — I10 Essential (primary) hypertension: Secondary | ICD-10-CM | POA: Diagnosis not present

## 2016-07-15 DIAGNOSIS — E119 Type 2 diabetes mellitus without complications: Secondary | ICD-10-CM | POA: Diagnosis not present

## 2016-07-15 DIAGNOSIS — K8000 Calculus of gallbladder with acute cholecystitis without obstruction: Secondary | ICD-10-CM | POA: Diagnosis not present

## 2016-07-15 DIAGNOSIS — K5909 Other constipation: Secondary | ICD-10-CM | POA: Diagnosis not present

## 2016-07-15 DIAGNOSIS — N186 End stage renal disease: Secondary | ICD-10-CM | POA: Diagnosis not present

## 2016-07-16 DIAGNOSIS — K5909 Other constipation: Secondary | ICD-10-CM | POA: Diagnosis not present

## 2016-07-16 DIAGNOSIS — E119 Type 2 diabetes mellitus without complications: Secondary | ICD-10-CM | POA: Diagnosis not present

## 2016-07-16 DIAGNOSIS — K8000 Calculus of gallbladder with acute cholecystitis without obstruction: Secondary | ICD-10-CM | POA: Diagnosis not present

## 2016-07-16 DIAGNOSIS — I10 Essential (primary) hypertension: Secondary | ICD-10-CM | POA: Diagnosis not present

## 2016-07-17 DIAGNOSIS — E785 Hyperlipidemia, unspecified: Secondary | ICD-10-CM | POA: Diagnosis not present

## 2016-07-17 DIAGNOSIS — D649 Anemia, unspecified: Secondary | ICD-10-CM | POA: Diagnosis not present

## 2016-07-17 DIAGNOSIS — E46 Unspecified protein-calorie malnutrition: Secondary | ICD-10-CM | POA: Diagnosis not present

## 2016-07-17 DIAGNOSIS — R748 Abnormal levels of other serum enzymes: Secondary | ICD-10-CM | POA: Diagnosis not present

## 2016-07-17 DIAGNOSIS — D509 Iron deficiency anemia, unspecified: Secondary | ICD-10-CM | POA: Diagnosis not present

## 2016-07-17 DIAGNOSIS — R7989 Other specified abnormal findings of blood chemistry: Secondary | ICD-10-CM | POA: Diagnosis not present

## 2016-07-17 DIAGNOSIS — N2581 Secondary hyperparathyroidism of renal origin: Secondary | ICD-10-CM | POA: Diagnosis not present

## 2016-07-17 DIAGNOSIS — N186 End stage renal disease: Secondary | ICD-10-CM | POA: Diagnosis not present

## 2016-07-17 DIAGNOSIS — E875 Hyperkalemia: Secondary | ICD-10-CM | POA: Diagnosis not present

## 2016-07-17 DIAGNOSIS — Z79899 Other long term (current) drug therapy: Secondary | ICD-10-CM | POA: Diagnosis not present

## 2016-07-17 DIAGNOSIS — E538 Deficiency of other specified B group vitamins: Secondary | ICD-10-CM | POA: Diagnosis not present

## 2016-07-17 DIAGNOSIS — D631 Anemia in chronic kidney disease: Secondary | ICD-10-CM | POA: Diagnosis not present

## 2016-07-19 DIAGNOSIS — D631 Anemia in chronic kidney disease: Secondary | ICD-10-CM | POA: Diagnosis not present

## 2016-07-19 DIAGNOSIS — F332 Major depressive disorder, recurrent severe without psychotic features: Secondary | ICD-10-CM | POA: Diagnosis not present

## 2016-07-19 DIAGNOSIS — D649 Anemia, unspecified: Secondary | ICD-10-CM | POA: Diagnosis not present

## 2016-07-19 DIAGNOSIS — N2581 Secondary hyperparathyroidism of renal origin: Secondary | ICD-10-CM | POA: Diagnosis not present

## 2016-07-19 DIAGNOSIS — E538 Deficiency of other specified B group vitamins: Secondary | ICD-10-CM | POA: Diagnosis not present

## 2016-07-19 DIAGNOSIS — N186 End stage renal disease: Secondary | ICD-10-CM | POA: Diagnosis not present

## 2016-07-20 DIAGNOSIS — E119 Type 2 diabetes mellitus without complications: Secondary | ICD-10-CM | POA: Diagnosis not present

## 2016-07-20 DIAGNOSIS — K5909 Other constipation: Secondary | ICD-10-CM | POA: Diagnosis not present

## 2016-07-20 DIAGNOSIS — K8000 Calculus of gallbladder with acute cholecystitis without obstruction: Secondary | ICD-10-CM | POA: Diagnosis not present

## 2016-07-20 DIAGNOSIS — I10 Essential (primary) hypertension: Secondary | ICD-10-CM | POA: Diagnosis not present

## 2016-07-22 DIAGNOSIS — N2581 Secondary hyperparathyroidism of renal origin: Secondary | ICD-10-CM | POA: Diagnosis not present

## 2016-07-22 DIAGNOSIS — E538 Deficiency of other specified B group vitamins: Secondary | ICD-10-CM | POA: Diagnosis not present

## 2016-07-22 DIAGNOSIS — D631 Anemia in chronic kidney disease: Secondary | ICD-10-CM | POA: Diagnosis not present

## 2016-07-22 DIAGNOSIS — D649 Anemia, unspecified: Secondary | ICD-10-CM | POA: Diagnosis not present

## 2016-07-22 DIAGNOSIS — N186 End stage renal disease: Secondary | ICD-10-CM | POA: Diagnosis not present

## 2016-07-23 DIAGNOSIS — K8000 Calculus of gallbladder with acute cholecystitis without obstruction: Secondary | ICD-10-CM | POA: Diagnosis not present

## 2016-07-23 DIAGNOSIS — I10 Essential (primary) hypertension: Secondary | ICD-10-CM | POA: Diagnosis not present

## 2016-07-23 DIAGNOSIS — K5909 Other constipation: Secondary | ICD-10-CM | POA: Diagnosis not present

## 2016-07-23 DIAGNOSIS — E119 Type 2 diabetes mellitus without complications: Secondary | ICD-10-CM | POA: Diagnosis not present

## 2016-07-24 DIAGNOSIS — N186 End stage renal disease: Secondary | ICD-10-CM | POA: Diagnosis not present

## 2016-07-24 DIAGNOSIS — D649 Anemia, unspecified: Secondary | ICD-10-CM | POA: Diagnosis not present

## 2016-07-24 DIAGNOSIS — E538 Deficiency of other specified B group vitamins: Secondary | ICD-10-CM | POA: Diagnosis not present

## 2016-07-24 DIAGNOSIS — D631 Anemia in chronic kidney disease: Secondary | ICD-10-CM | POA: Diagnosis not present

## 2016-07-24 DIAGNOSIS — N2581 Secondary hyperparathyroidism of renal origin: Secondary | ICD-10-CM | POA: Diagnosis not present

## 2016-07-26 DIAGNOSIS — D631 Anemia in chronic kidney disease: Secondary | ICD-10-CM | POA: Diagnosis not present

## 2016-07-26 DIAGNOSIS — E538 Deficiency of other specified B group vitamins: Secondary | ICD-10-CM | POA: Diagnosis not present

## 2016-07-26 DIAGNOSIS — N186 End stage renal disease: Secondary | ICD-10-CM | POA: Diagnosis not present

## 2016-07-26 DIAGNOSIS — D649 Anemia, unspecified: Secondary | ICD-10-CM | POA: Diagnosis not present

## 2016-07-26 DIAGNOSIS — N2581 Secondary hyperparathyroidism of renal origin: Secondary | ICD-10-CM | POA: Diagnosis not present

## 2016-07-29 DIAGNOSIS — N2581 Secondary hyperparathyroidism of renal origin: Secondary | ICD-10-CM | POA: Diagnosis not present

## 2016-07-29 DIAGNOSIS — E538 Deficiency of other specified B group vitamins: Secondary | ICD-10-CM | POA: Diagnosis not present

## 2016-07-29 DIAGNOSIS — D631 Anemia in chronic kidney disease: Secondary | ICD-10-CM | POA: Diagnosis not present

## 2016-07-29 DIAGNOSIS — D649 Anemia, unspecified: Secondary | ICD-10-CM | POA: Diagnosis not present

## 2016-07-29 DIAGNOSIS — N186 End stage renal disease: Secondary | ICD-10-CM | POA: Diagnosis not present

## 2016-07-31 DIAGNOSIS — D631 Anemia in chronic kidney disease: Secondary | ICD-10-CM | POA: Diagnosis not present

## 2016-07-31 DIAGNOSIS — N2581 Secondary hyperparathyroidism of renal origin: Secondary | ICD-10-CM | POA: Diagnosis not present

## 2016-07-31 DIAGNOSIS — N186 End stage renal disease: Secondary | ICD-10-CM | POA: Diagnosis not present

## 2016-08-01 DIAGNOSIS — E1122 Type 2 diabetes mellitus with diabetic chronic kidney disease: Secondary | ICD-10-CM | POA: Diagnosis not present

## 2016-08-01 DIAGNOSIS — I129 Hypertensive chronic kidney disease with stage 1 through stage 4 chronic kidney disease, or unspecified chronic kidney disease: Secondary | ICD-10-CM | POA: Diagnosis not present

## 2016-08-01 DIAGNOSIS — E119 Type 2 diabetes mellitus without complications: Secondary | ICD-10-CM | POA: Diagnosis not present

## 2016-08-01 DIAGNOSIS — N186 End stage renal disease: Secondary | ICD-10-CM | POA: Diagnosis not present

## 2016-08-01 DIAGNOSIS — I25810 Atherosclerosis of coronary artery bypass graft(s) without angina pectoris: Secondary | ICD-10-CM | POA: Diagnosis not present

## 2016-08-01 DIAGNOSIS — I10 Essential (primary) hypertension: Secondary | ICD-10-CM | POA: Diagnosis not present

## 2016-08-02 DIAGNOSIS — D631 Anemia in chronic kidney disease: Secondary | ICD-10-CM | POA: Diagnosis not present

## 2016-08-02 DIAGNOSIS — N186 End stage renal disease: Secondary | ICD-10-CM | POA: Diagnosis not present

## 2016-08-02 DIAGNOSIS — F332 Major depressive disorder, recurrent severe without psychotic features: Secondary | ICD-10-CM | POA: Diagnosis not present

## 2016-08-02 DIAGNOSIS — N2581 Secondary hyperparathyroidism of renal origin: Secondary | ICD-10-CM | POA: Diagnosis not present

## 2016-08-05 DIAGNOSIS — D631 Anemia in chronic kidney disease: Secondary | ICD-10-CM | POA: Diagnosis not present

## 2016-08-05 DIAGNOSIS — N186 End stage renal disease: Secondary | ICD-10-CM | POA: Diagnosis not present

## 2016-08-05 DIAGNOSIS — N2581 Secondary hyperparathyroidism of renal origin: Secondary | ICD-10-CM | POA: Diagnosis not present

## 2016-08-06 DIAGNOSIS — E119 Type 2 diabetes mellitus without complications: Secondary | ICD-10-CM | POA: Diagnosis not present

## 2016-08-06 DIAGNOSIS — K8000 Calculus of gallbladder with acute cholecystitis without obstruction: Secondary | ICD-10-CM | POA: Diagnosis not present

## 2016-08-06 DIAGNOSIS — I10 Essential (primary) hypertension: Secondary | ICD-10-CM | POA: Diagnosis not present

## 2016-08-06 DIAGNOSIS — K5909 Other constipation: Secondary | ICD-10-CM | POA: Diagnosis not present

## 2016-08-07 DIAGNOSIS — D631 Anemia in chronic kidney disease: Secondary | ICD-10-CM | POA: Diagnosis not present

## 2016-08-07 DIAGNOSIS — N186 End stage renal disease: Secondary | ICD-10-CM | POA: Diagnosis not present

## 2016-08-07 DIAGNOSIS — N2581 Secondary hyperparathyroidism of renal origin: Secondary | ICD-10-CM | POA: Diagnosis not present

## 2016-08-08 DIAGNOSIS — K5901 Slow transit constipation: Secondary | ICD-10-CM | POA: Diagnosis not present

## 2016-08-08 DIAGNOSIS — Z1239 Encounter for other screening for malignant neoplasm of breast: Secondary | ICD-10-CM | POA: Diagnosis not present

## 2016-08-08 DIAGNOSIS — I120 Hypertensive chronic kidney disease with stage 5 chronic kidney disease or end stage renal disease: Secondary | ICD-10-CM | POA: Diagnosis not present

## 2016-08-08 DIAGNOSIS — E1122 Type 2 diabetes mellitus with diabetic chronic kidney disease: Secondary | ICD-10-CM | POA: Diagnosis not present

## 2016-08-08 DIAGNOSIS — N186 End stage renal disease: Secondary | ICD-10-CM | POA: Diagnosis not present

## 2016-08-08 DIAGNOSIS — Z1211 Encounter for screening for malignant neoplasm of colon: Secondary | ICD-10-CM | POA: Diagnosis not present

## 2016-08-08 DIAGNOSIS — E782 Mixed hyperlipidemia: Secondary | ICD-10-CM | POA: Diagnosis not present

## 2016-08-09 DIAGNOSIS — N186 End stage renal disease: Secondary | ICD-10-CM | POA: Diagnosis not present

## 2016-08-09 DIAGNOSIS — D631 Anemia in chronic kidney disease: Secondary | ICD-10-CM | POA: Diagnosis not present

## 2016-08-09 DIAGNOSIS — N2581 Secondary hyperparathyroidism of renal origin: Secondary | ICD-10-CM | POA: Diagnosis not present

## 2016-08-12 DIAGNOSIS — N2581 Secondary hyperparathyroidism of renal origin: Secondary | ICD-10-CM | POA: Diagnosis not present

## 2016-08-12 DIAGNOSIS — N186 End stage renal disease: Secondary | ICD-10-CM | POA: Diagnosis not present

## 2016-08-12 DIAGNOSIS — D631 Anemia in chronic kidney disease: Secondary | ICD-10-CM | POA: Diagnosis not present

## 2016-08-14 DIAGNOSIS — N2581 Secondary hyperparathyroidism of renal origin: Secondary | ICD-10-CM | POA: Diagnosis not present

## 2016-08-14 DIAGNOSIS — N186 End stage renal disease: Secondary | ICD-10-CM | POA: Diagnosis not present

## 2016-08-15 DIAGNOSIS — G459 Transient cerebral ischemic attack, unspecified: Secondary | ICD-10-CM | POA: Diagnosis not present

## 2016-08-15 DIAGNOSIS — N186 End stage renal disease: Secondary | ICD-10-CM | POA: Diagnosis not present

## 2016-08-15 DIAGNOSIS — R0989 Other specified symptoms and signs involving the circulatory and respiratory systems: Secondary | ICD-10-CM | POA: Diagnosis not present

## 2016-08-16 DIAGNOSIS — D631 Anemia in chronic kidney disease: Secondary | ICD-10-CM | POA: Diagnosis not present

## 2016-08-16 DIAGNOSIS — F332 Major depressive disorder, recurrent severe without psychotic features: Secondary | ICD-10-CM | POA: Diagnosis not present

## 2016-08-16 DIAGNOSIS — N186 End stage renal disease: Secondary | ICD-10-CM | POA: Diagnosis not present

## 2016-08-16 DIAGNOSIS — N2581 Secondary hyperparathyroidism of renal origin: Secondary | ICD-10-CM | POA: Diagnosis not present

## 2016-08-19 DIAGNOSIS — N2581 Secondary hyperparathyroidism of renal origin: Secondary | ICD-10-CM | POA: Diagnosis not present

## 2016-08-19 DIAGNOSIS — N186 End stage renal disease: Secondary | ICD-10-CM | POA: Diagnosis not present

## 2016-08-19 DIAGNOSIS — D631 Anemia in chronic kidney disease: Secondary | ICD-10-CM | POA: Diagnosis not present

## 2016-08-20 DIAGNOSIS — Z1231 Encounter for screening mammogram for malignant neoplasm of breast: Secondary | ICD-10-CM | POA: Diagnosis not present

## 2016-08-21 DIAGNOSIS — N186 End stage renal disease: Secondary | ICD-10-CM | POA: Diagnosis not present

## 2016-08-21 DIAGNOSIS — R7989 Other specified abnormal findings of blood chemistry: Secondary | ICD-10-CM | POA: Diagnosis not present

## 2016-08-21 DIAGNOSIS — D631 Anemia in chronic kidney disease: Secondary | ICD-10-CM | POA: Diagnosis not present

## 2016-08-21 DIAGNOSIS — Z79899 Other long term (current) drug therapy: Secondary | ICD-10-CM | POA: Diagnosis not present

## 2016-08-21 DIAGNOSIS — N2581 Secondary hyperparathyroidism of renal origin: Secondary | ICD-10-CM | POA: Diagnosis not present

## 2016-08-21 DIAGNOSIS — E785 Hyperlipidemia, unspecified: Secondary | ICD-10-CM | POA: Diagnosis not present

## 2016-08-22 DIAGNOSIS — I10 Essential (primary) hypertension: Secondary | ICD-10-CM | POA: Diagnosis not present

## 2016-08-22 DIAGNOSIS — I770 Arteriovenous fistula, acquired: Secondary | ICD-10-CM | POA: Diagnosis not present

## 2016-08-22 DIAGNOSIS — N185 Chronic kidney disease, stage 5: Secondary | ICD-10-CM | POA: Diagnosis not present

## 2016-08-22 DIAGNOSIS — E785 Hyperlipidemia, unspecified: Secondary | ICD-10-CM | POA: Diagnosis not present

## 2016-08-23 DIAGNOSIS — N186 End stage renal disease: Secondary | ICD-10-CM | POA: Diagnosis not present

## 2016-08-23 DIAGNOSIS — F332 Major depressive disorder, recurrent severe without psychotic features: Secondary | ICD-10-CM | POA: Diagnosis not present

## 2016-08-23 DIAGNOSIS — D631 Anemia in chronic kidney disease: Secondary | ICD-10-CM | POA: Diagnosis not present

## 2016-08-23 DIAGNOSIS — N2581 Secondary hyperparathyroidism of renal origin: Secondary | ICD-10-CM | POA: Diagnosis not present

## 2016-08-27 DIAGNOSIS — T82591A Other mechanical complication of surgically created arteriovenous shunt, initial encounter: Secondary | ICD-10-CM | POA: Diagnosis not present

## 2016-08-27 DIAGNOSIS — D631 Anemia in chronic kidney disease: Secondary | ICD-10-CM | POA: Diagnosis not present

## 2016-08-27 DIAGNOSIS — N186 End stage renal disease: Secondary | ICD-10-CM | POA: Diagnosis not present

## 2016-08-27 DIAGNOSIS — N2581 Secondary hyperparathyroidism of renal origin: Secondary | ICD-10-CM | POA: Diagnosis not present

## 2016-08-28 DIAGNOSIS — D631 Anemia in chronic kidney disease: Secondary | ICD-10-CM | POA: Diagnosis not present

## 2016-08-28 DIAGNOSIS — N2581 Secondary hyperparathyroidism of renal origin: Secondary | ICD-10-CM | POA: Diagnosis not present

## 2016-08-28 DIAGNOSIS — N186 End stage renal disease: Secondary | ICD-10-CM | POA: Diagnosis not present

## 2016-08-30 DIAGNOSIS — D631 Anemia in chronic kidney disease: Secondary | ICD-10-CM | POA: Diagnosis not present

## 2016-08-30 DIAGNOSIS — N2581 Secondary hyperparathyroidism of renal origin: Secondary | ICD-10-CM | POA: Diagnosis not present

## 2016-08-30 DIAGNOSIS — F332 Major depressive disorder, recurrent severe without psychotic features: Secondary | ICD-10-CM | POA: Diagnosis not present

## 2016-08-30 DIAGNOSIS — N186 End stage renal disease: Secondary | ICD-10-CM | POA: Diagnosis not present

## 2016-09-02 DIAGNOSIS — D631 Anemia in chronic kidney disease: Secondary | ICD-10-CM | POA: Diagnosis not present

## 2016-09-02 DIAGNOSIS — N2581 Secondary hyperparathyroidism of renal origin: Secondary | ICD-10-CM | POA: Diagnosis not present

## 2016-09-02 DIAGNOSIS — N186 End stage renal disease: Secondary | ICD-10-CM | POA: Diagnosis not present

## 2016-09-04 DIAGNOSIS — D631 Anemia in chronic kidney disease: Secondary | ICD-10-CM | POA: Diagnosis not present

## 2016-09-04 DIAGNOSIS — N186 End stage renal disease: Secondary | ICD-10-CM | POA: Diagnosis not present

## 2016-09-04 DIAGNOSIS — N2581 Secondary hyperparathyroidism of renal origin: Secondary | ICD-10-CM | POA: Diagnosis not present

## 2016-09-06 DIAGNOSIS — N186 End stage renal disease: Secondary | ICD-10-CM | POA: Diagnosis not present

## 2016-09-06 DIAGNOSIS — F332 Major depressive disorder, recurrent severe without psychotic features: Secondary | ICD-10-CM | POA: Diagnosis not present

## 2016-09-06 DIAGNOSIS — N2581 Secondary hyperparathyroidism of renal origin: Secondary | ICD-10-CM | POA: Diagnosis not present

## 2016-09-06 DIAGNOSIS — D631 Anemia in chronic kidney disease: Secondary | ICD-10-CM | POA: Diagnosis not present

## 2016-09-09 DIAGNOSIS — N186 End stage renal disease: Secondary | ICD-10-CM | POA: Diagnosis not present

## 2016-09-09 DIAGNOSIS — N2581 Secondary hyperparathyroidism of renal origin: Secondary | ICD-10-CM | POA: Diagnosis not present

## 2016-09-09 DIAGNOSIS — D631 Anemia in chronic kidney disease: Secondary | ICD-10-CM | POA: Diagnosis not present

## 2016-09-11 DIAGNOSIS — N186 End stage renal disease: Secondary | ICD-10-CM | POA: Diagnosis not present

## 2016-09-11 DIAGNOSIS — D631 Anemia in chronic kidney disease: Secondary | ICD-10-CM | POA: Diagnosis not present

## 2016-09-11 DIAGNOSIS — N2581 Secondary hyperparathyroidism of renal origin: Secondary | ICD-10-CM | POA: Diagnosis not present

## 2016-09-13 DIAGNOSIS — N186 End stage renal disease: Secondary | ICD-10-CM | POA: Diagnosis not present

## 2016-09-13 DIAGNOSIS — D631 Anemia in chronic kidney disease: Secondary | ICD-10-CM | POA: Diagnosis not present

## 2016-09-13 DIAGNOSIS — N2581 Secondary hyperparathyroidism of renal origin: Secondary | ICD-10-CM | POA: Diagnosis not present

## 2016-09-13 DIAGNOSIS — F332 Major depressive disorder, recurrent severe without psychotic features: Secondary | ICD-10-CM | POA: Diagnosis not present

## 2016-09-14 DIAGNOSIS — N186 End stage renal disease: Secondary | ICD-10-CM | POA: Diagnosis not present

## 2016-09-16 DIAGNOSIS — N186 End stage renal disease: Secondary | ICD-10-CM | POA: Diagnosis not present

## 2016-09-16 DIAGNOSIS — D631 Anemia in chronic kidney disease: Secondary | ICD-10-CM | POA: Diagnosis not present

## 2016-09-16 DIAGNOSIS — N2581 Secondary hyperparathyroidism of renal origin: Secondary | ICD-10-CM | POA: Diagnosis not present

## 2016-09-17 DIAGNOSIS — R1011 Right upper quadrant pain: Secondary | ICD-10-CM | POA: Diagnosis not present

## 2016-09-17 DIAGNOSIS — K8020 Calculus of gallbladder without cholecystitis without obstruction: Secondary | ICD-10-CM | POA: Diagnosis not present

## 2016-09-18 DIAGNOSIS — N2581 Secondary hyperparathyroidism of renal origin: Secondary | ICD-10-CM | POA: Diagnosis not present

## 2016-09-18 DIAGNOSIS — D631 Anemia in chronic kidney disease: Secondary | ICD-10-CM | POA: Diagnosis not present

## 2016-09-18 DIAGNOSIS — Z79899 Other long term (current) drug therapy: Secondary | ICD-10-CM | POA: Diagnosis not present

## 2016-09-18 DIAGNOSIS — N186 End stage renal disease: Secondary | ICD-10-CM | POA: Diagnosis not present

## 2016-09-18 DIAGNOSIS — E785 Hyperlipidemia, unspecified: Secondary | ICD-10-CM | POA: Diagnosis not present

## 2016-09-18 DIAGNOSIS — R7989 Other specified abnormal findings of blood chemistry: Secondary | ICD-10-CM | POA: Diagnosis not present

## 2016-09-20 DIAGNOSIS — F332 Major depressive disorder, recurrent severe without psychotic features: Secondary | ICD-10-CM | POA: Diagnosis not present

## 2016-09-20 DIAGNOSIS — D631 Anemia in chronic kidney disease: Secondary | ICD-10-CM | POA: Diagnosis not present

## 2016-09-20 DIAGNOSIS — N2581 Secondary hyperparathyroidism of renal origin: Secondary | ICD-10-CM | POA: Diagnosis not present

## 2016-09-20 DIAGNOSIS — N186 End stage renal disease: Secondary | ICD-10-CM | POA: Diagnosis not present

## 2016-09-23 DIAGNOSIS — E785 Hyperlipidemia, unspecified: Secondary | ICD-10-CM | POA: Diagnosis not present

## 2016-09-23 DIAGNOSIS — N186 End stage renal disease: Secondary | ICD-10-CM | POA: Diagnosis not present

## 2016-09-23 DIAGNOSIS — E669 Obesity, unspecified: Secondary | ICD-10-CM | POA: Diagnosis not present

## 2016-09-23 DIAGNOSIS — I10 Essential (primary) hypertension: Secondary | ICD-10-CM | POA: Diagnosis not present

## 2016-09-23 DIAGNOSIS — D631 Anemia in chronic kidney disease: Secondary | ICD-10-CM | POA: Diagnosis not present

## 2016-09-23 DIAGNOSIS — N2581 Secondary hyperparathyroidism of renal origin: Secondary | ICD-10-CM | POA: Diagnosis not present

## 2016-09-25 DIAGNOSIS — N186 End stage renal disease: Secondary | ICD-10-CM | POA: Diagnosis not present

## 2016-09-25 DIAGNOSIS — N2581 Secondary hyperparathyroidism of renal origin: Secondary | ICD-10-CM | POA: Diagnosis not present

## 2016-09-25 DIAGNOSIS — D631 Anemia in chronic kidney disease: Secondary | ICD-10-CM | POA: Diagnosis not present

## 2016-09-27 DIAGNOSIS — N186 End stage renal disease: Secondary | ICD-10-CM | POA: Diagnosis not present

## 2016-09-27 DIAGNOSIS — D631 Anemia in chronic kidney disease: Secondary | ICD-10-CM | POA: Diagnosis not present

## 2016-09-27 DIAGNOSIS — N2581 Secondary hyperparathyroidism of renal origin: Secondary | ICD-10-CM | POA: Diagnosis not present

## 2016-09-27 DIAGNOSIS — F332 Major depressive disorder, recurrent severe without psychotic features: Secondary | ICD-10-CM | POA: Diagnosis not present

## 2016-09-30 DIAGNOSIS — N186 End stage renal disease: Secondary | ICD-10-CM | POA: Diagnosis not present

## 2016-09-30 DIAGNOSIS — D631 Anemia in chronic kidney disease: Secondary | ICD-10-CM | POA: Diagnosis not present

## 2016-09-30 DIAGNOSIS — N2581 Secondary hyperparathyroidism of renal origin: Secondary | ICD-10-CM | POA: Diagnosis not present

## 2016-10-02 DIAGNOSIS — D631 Anemia in chronic kidney disease: Secondary | ICD-10-CM | POA: Diagnosis not present

## 2016-10-02 DIAGNOSIS — N186 End stage renal disease: Secondary | ICD-10-CM | POA: Diagnosis not present

## 2016-10-02 DIAGNOSIS — N2581 Secondary hyperparathyroidism of renal origin: Secondary | ICD-10-CM | POA: Diagnosis not present

## 2016-10-04 DIAGNOSIS — F332 Major depressive disorder, recurrent severe without psychotic features: Secondary | ICD-10-CM | POA: Diagnosis not present

## 2016-10-04 DIAGNOSIS — N186 End stage renal disease: Secondary | ICD-10-CM | POA: Diagnosis not present

## 2016-10-04 DIAGNOSIS — D631 Anemia in chronic kidney disease: Secondary | ICD-10-CM | POA: Diagnosis not present

## 2016-10-04 DIAGNOSIS — N2581 Secondary hyperparathyroidism of renal origin: Secondary | ICD-10-CM | POA: Diagnosis not present

## 2016-10-07 DIAGNOSIS — N2581 Secondary hyperparathyroidism of renal origin: Secondary | ICD-10-CM | POA: Diagnosis not present

## 2016-10-07 DIAGNOSIS — D631 Anemia in chronic kidney disease: Secondary | ICD-10-CM | POA: Diagnosis not present

## 2016-10-07 DIAGNOSIS — N186 End stage renal disease: Secondary | ICD-10-CM | POA: Diagnosis not present

## 2016-10-08 DIAGNOSIS — R1011 Right upper quadrant pain: Secondary | ICD-10-CM | POA: Diagnosis not present

## 2016-10-08 DIAGNOSIS — K8020 Calculus of gallbladder without cholecystitis without obstruction: Secondary | ICD-10-CM | POA: Diagnosis not present

## 2016-10-09 DIAGNOSIS — N186 End stage renal disease: Secondary | ICD-10-CM | POA: Diagnosis not present

## 2016-10-09 DIAGNOSIS — D631 Anemia in chronic kidney disease: Secondary | ICD-10-CM | POA: Diagnosis not present

## 2016-10-09 DIAGNOSIS — N2581 Secondary hyperparathyroidism of renal origin: Secondary | ICD-10-CM | POA: Diagnosis not present

## 2016-10-11 DIAGNOSIS — N186 End stage renal disease: Secondary | ICD-10-CM | POA: Diagnosis not present

## 2016-10-11 DIAGNOSIS — D631 Anemia in chronic kidney disease: Secondary | ICD-10-CM | POA: Diagnosis not present

## 2016-10-11 DIAGNOSIS — N2581 Secondary hyperparathyroidism of renal origin: Secondary | ICD-10-CM | POA: Diagnosis not present

## 2016-10-11 DIAGNOSIS — F332 Major depressive disorder, recurrent severe without psychotic features: Secondary | ICD-10-CM | POA: Diagnosis not present

## 2016-10-14 DIAGNOSIS — N2581 Secondary hyperparathyroidism of renal origin: Secondary | ICD-10-CM | POA: Diagnosis not present

## 2016-10-14 DIAGNOSIS — N186 End stage renal disease: Secondary | ICD-10-CM | POA: Diagnosis not present

## 2016-10-14 DIAGNOSIS — D631 Anemia in chronic kidney disease: Secondary | ICD-10-CM | POA: Diagnosis not present

## 2016-10-15 DIAGNOSIS — N186 End stage renal disease: Secondary | ICD-10-CM | POA: Diagnosis not present

## 2016-10-16 DIAGNOSIS — D509 Iron deficiency anemia, unspecified: Secondary | ICD-10-CM | POA: Diagnosis not present

## 2016-10-16 DIAGNOSIS — E46 Unspecified protein-calorie malnutrition: Secondary | ICD-10-CM | POA: Diagnosis not present

## 2016-10-16 DIAGNOSIS — Z79899 Other long term (current) drug therapy: Secondary | ICD-10-CM | POA: Diagnosis not present

## 2016-10-16 DIAGNOSIS — D649 Anemia, unspecified: Secondary | ICD-10-CM | POA: Diagnosis not present

## 2016-10-16 DIAGNOSIS — E875 Hyperkalemia: Secondary | ICD-10-CM | POA: Diagnosis not present

## 2016-10-16 DIAGNOSIS — R7989 Other specified abnormal findings of blood chemistry: Secondary | ICD-10-CM | POA: Diagnosis not present

## 2016-10-16 DIAGNOSIS — N2581 Secondary hyperparathyroidism of renal origin: Secondary | ICD-10-CM | POA: Diagnosis not present

## 2016-10-16 DIAGNOSIS — N186 End stage renal disease: Secondary | ICD-10-CM | POA: Diagnosis not present

## 2016-10-16 DIAGNOSIS — E785 Hyperlipidemia, unspecified: Secondary | ICD-10-CM | POA: Diagnosis not present

## 2016-10-16 DIAGNOSIS — E538 Deficiency of other specified B group vitamins: Secondary | ICD-10-CM | POA: Diagnosis not present

## 2016-10-16 DIAGNOSIS — R748 Abnormal levels of other serum enzymes: Secondary | ICD-10-CM | POA: Diagnosis not present

## 2016-10-16 DIAGNOSIS — D631 Anemia in chronic kidney disease: Secondary | ICD-10-CM | POA: Diagnosis not present

## 2016-10-17 DIAGNOSIS — Z992 Dependence on renal dialysis: Secondary | ICD-10-CM | POA: Diagnosis not present

## 2016-10-17 DIAGNOSIS — N186 End stage renal disease: Secondary | ICD-10-CM | POA: Diagnosis not present

## 2016-10-17 DIAGNOSIS — E1122 Type 2 diabetes mellitus with diabetic chronic kidney disease: Secondary | ICD-10-CM | POA: Diagnosis not present

## 2016-10-18 DIAGNOSIS — N2581 Secondary hyperparathyroidism of renal origin: Secondary | ICD-10-CM | POA: Diagnosis not present

## 2016-10-18 DIAGNOSIS — N186 End stage renal disease: Secondary | ICD-10-CM | POA: Diagnosis not present

## 2016-10-21 DIAGNOSIS — N186 End stage renal disease: Secondary | ICD-10-CM | POA: Diagnosis not present

## 2016-10-21 DIAGNOSIS — N2581 Secondary hyperparathyroidism of renal origin: Secondary | ICD-10-CM | POA: Diagnosis not present

## 2016-10-23 DIAGNOSIS — N186 End stage renal disease: Secondary | ICD-10-CM | POA: Diagnosis not present

## 2016-10-23 DIAGNOSIS — N2581 Secondary hyperparathyroidism of renal origin: Secondary | ICD-10-CM | POA: Diagnosis not present

## 2016-10-25 DIAGNOSIS — D631 Anemia in chronic kidney disease: Secondary | ICD-10-CM | POA: Diagnosis not present

## 2016-10-25 DIAGNOSIS — N2581 Secondary hyperparathyroidism of renal origin: Secondary | ICD-10-CM | POA: Diagnosis not present

## 2016-10-25 DIAGNOSIS — F332 Major depressive disorder, recurrent severe without psychotic features: Secondary | ICD-10-CM | POA: Diagnosis not present

## 2016-10-25 DIAGNOSIS — N186 End stage renal disease: Secondary | ICD-10-CM | POA: Diagnosis not present

## 2016-10-28 DIAGNOSIS — N186 End stage renal disease: Secondary | ICD-10-CM | POA: Diagnosis not present

## 2016-10-28 DIAGNOSIS — N2581 Secondary hyperparathyroidism of renal origin: Secondary | ICD-10-CM | POA: Diagnosis not present

## 2016-10-28 DIAGNOSIS — D631 Anemia in chronic kidney disease: Secondary | ICD-10-CM | POA: Diagnosis not present

## 2016-10-30 DIAGNOSIS — D631 Anemia in chronic kidney disease: Secondary | ICD-10-CM | POA: Diagnosis not present

## 2016-10-30 DIAGNOSIS — N186 End stage renal disease: Secondary | ICD-10-CM | POA: Diagnosis not present

## 2016-10-30 DIAGNOSIS — N2581 Secondary hyperparathyroidism of renal origin: Secondary | ICD-10-CM | POA: Diagnosis not present

## 2016-11-01 DIAGNOSIS — N2581 Secondary hyperparathyroidism of renal origin: Secondary | ICD-10-CM | POA: Diagnosis not present

## 2016-11-01 DIAGNOSIS — F332 Major depressive disorder, recurrent severe without psychotic features: Secondary | ICD-10-CM | POA: Diagnosis not present

## 2016-11-01 DIAGNOSIS — D631 Anemia in chronic kidney disease: Secondary | ICD-10-CM | POA: Diagnosis not present

## 2016-11-01 DIAGNOSIS — N186 End stage renal disease: Secondary | ICD-10-CM | POA: Diagnosis not present

## 2016-11-04 DIAGNOSIS — K828 Other specified diseases of gallbladder: Secondary | ICD-10-CM | POA: Diagnosis not present

## 2016-11-04 DIAGNOSIS — K8020 Calculus of gallbladder without cholecystitis without obstruction: Secondary | ICD-10-CM | POA: Diagnosis not present

## 2016-11-04 DIAGNOSIS — R1011 Right upper quadrant pain: Secondary | ICD-10-CM | POA: Diagnosis not present

## 2016-11-04 DIAGNOSIS — N2581 Secondary hyperparathyroidism of renal origin: Secondary | ICD-10-CM | POA: Diagnosis not present

## 2016-11-04 DIAGNOSIS — D631 Anemia in chronic kidney disease: Secondary | ICD-10-CM | POA: Diagnosis not present

## 2016-11-04 DIAGNOSIS — N186 End stage renal disease: Secondary | ICD-10-CM | POA: Diagnosis not present

## 2016-11-08 DIAGNOSIS — N186 End stage renal disease: Secondary | ICD-10-CM | POA: Diagnosis not present

## 2016-11-11 DIAGNOSIS — N186 End stage renal disease: Secondary | ICD-10-CM | POA: Diagnosis not present

## 2016-11-13 DIAGNOSIS — N186 End stage renal disease: Secondary | ICD-10-CM | POA: Diagnosis not present

## 2016-11-13 DIAGNOSIS — D631 Anemia in chronic kidney disease: Secondary | ICD-10-CM | POA: Diagnosis not present

## 2016-11-13 DIAGNOSIS — N2581 Secondary hyperparathyroidism of renal origin: Secondary | ICD-10-CM | POA: Diagnosis not present

## 2016-11-14 DIAGNOSIS — N186 End stage renal disease: Secondary | ICD-10-CM | POA: Diagnosis not present

## 2016-11-15 DIAGNOSIS — F332 Major depressive disorder, recurrent severe without psychotic features: Secondary | ICD-10-CM | POA: Diagnosis not present

## 2016-11-15 DIAGNOSIS — N186 End stage renal disease: Secondary | ICD-10-CM | POA: Diagnosis not present

## 2016-11-15 DIAGNOSIS — N2581 Secondary hyperparathyroidism of renal origin: Secondary | ICD-10-CM | POA: Diagnosis not present

## 2016-11-15 DIAGNOSIS — D631 Anemia in chronic kidney disease: Secondary | ICD-10-CM | POA: Diagnosis not present

## 2016-11-18 DIAGNOSIS — N186 End stage renal disease: Secondary | ICD-10-CM | POA: Diagnosis not present

## 2016-11-18 DIAGNOSIS — D631 Anemia in chronic kidney disease: Secondary | ICD-10-CM | POA: Diagnosis not present

## 2016-11-18 DIAGNOSIS — N2581 Secondary hyperparathyroidism of renal origin: Secondary | ICD-10-CM | POA: Diagnosis not present

## 2016-11-20 DIAGNOSIS — N186 End stage renal disease: Secondary | ICD-10-CM | POA: Diagnosis not present

## 2016-11-20 DIAGNOSIS — N2581 Secondary hyperparathyroidism of renal origin: Secondary | ICD-10-CM | POA: Diagnosis not present

## 2016-11-20 DIAGNOSIS — D631 Anemia in chronic kidney disease: Secondary | ICD-10-CM | POA: Diagnosis not present

## 2016-11-20 DIAGNOSIS — E785 Hyperlipidemia, unspecified: Secondary | ICD-10-CM | POA: Diagnosis not present

## 2016-11-20 DIAGNOSIS — D649 Anemia, unspecified: Secondary | ICD-10-CM | POA: Diagnosis not present

## 2016-11-20 DIAGNOSIS — E875 Hyperkalemia: Secondary | ICD-10-CM | POA: Diagnosis not present

## 2016-11-20 DIAGNOSIS — E538 Deficiency of other specified B group vitamins: Secondary | ICD-10-CM | POA: Diagnosis not present

## 2016-11-20 DIAGNOSIS — D509 Iron deficiency anemia, unspecified: Secondary | ICD-10-CM | POA: Diagnosis not present

## 2016-11-20 DIAGNOSIS — R7989 Other specified abnormal findings of blood chemistry: Secondary | ICD-10-CM | POA: Diagnosis not present

## 2016-11-20 DIAGNOSIS — R748 Abnormal levels of other serum enzymes: Secondary | ICD-10-CM | POA: Diagnosis not present

## 2016-11-20 DIAGNOSIS — E46 Unspecified protein-calorie malnutrition: Secondary | ICD-10-CM | POA: Diagnosis not present

## 2016-11-20 DIAGNOSIS — Z79899 Other long term (current) drug therapy: Secondary | ICD-10-CM | POA: Diagnosis not present

## 2016-11-21 DIAGNOSIS — K812 Acute cholecystitis with chronic cholecystitis: Secondary | ICD-10-CM | POA: Diagnosis not present

## 2016-11-22 DIAGNOSIS — N186 End stage renal disease: Secondary | ICD-10-CM | POA: Diagnosis not present

## 2016-11-22 DIAGNOSIS — D631 Anemia in chronic kidney disease: Secondary | ICD-10-CM | POA: Diagnosis not present

## 2016-11-22 DIAGNOSIS — E538 Deficiency of other specified B group vitamins: Secondary | ICD-10-CM | POA: Diagnosis not present

## 2016-11-22 DIAGNOSIS — D649 Anemia, unspecified: Secondary | ICD-10-CM | POA: Diagnosis not present

## 2016-11-22 DIAGNOSIS — N2581 Secondary hyperparathyroidism of renal origin: Secondary | ICD-10-CM | POA: Diagnosis not present

## 2016-11-22 DIAGNOSIS — F332 Major depressive disorder, recurrent severe without psychotic features: Secondary | ICD-10-CM | POA: Diagnosis not present

## 2016-11-25 DIAGNOSIS — D649 Anemia, unspecified: Secondary | ICD-10-CM | POA: Diagnosis not present

## 2016-11-25 DIAGNOSIS — N186 End stage renal disease: Secondary | ICD-10-CM | POA: Diagnosis not present

## 2016-11-25 DIAGNOSIS — D631 Anemia in chronic kidney disease: Secondary | ICD-10-CM | POA: Diagnosis not present

## 2016-11-25 DIAGNOSIS — E538 Deficiency of other specified B group vitamins: Secondary | ICD-10-CM | POA: Diagnosis not present

## 2016-11-25 DIAGNOSIS — N2581 Secondary hyperparathyroidism of renal origin: Secondary | ICD-10-CM | POA: Diagnosis not present

## 2016-11-27 DIAGNOSIS — N186 End stage renal disease: Secondary | ICD-10-CM | POA: Diagnosis not present

## 2016-11-27 DIAGNOSIS — D649 Anemia, unspecified: Secondary | ICD-10-CM | POA: Diagnosis not present

## 2016-11-27 DIAGNOSIS — N2581 Secondary hyperparathyroidism of renal origin: Secondary | ICD-10-CM | POA: Diagnosis not present

## 2016-11-27 DIAGNOSIS — D631 Anemia in chronic kidney disease: Secondary | ICD-10-CM | POA: Diagnosis not present

## 2016-11-27 DIAGNOSIS — E538 Deficiency of other specified B group vitamins: Secondary | ICD-10-CM | POA: Diagnosis not present

## 2016-11-28 DIAGNOSIS — E785 Hyperlipidemia, unspecified: Secondary | ICD-10-CM | POA: Diagnosis not present

## 2016-11-28 DIAGNOSIS — I770 Arteriovenous fistula, acquired: Secondary | ICD-10-CM | POA: Diagnosis not present

## 2016-11-28 DIAGNOSIS — I10 Essential (primary) hypertension: Secondary | ICD-10-CM | POA: Diagnosis not present

## 2016-11-28 DIAGNOSIS — N185 Chronic kidney disease, stage 5: Secondary | ICD-10-CM | POA: Diagnosis not present

## 2016-11-29 DIAGNOSIS — F332 Major depressive disorder, recurrent severe without psychotic features: Secondary | ICD-10-CM | POA: Diagnosis not present

## 2016-11-29 DIAGNOSIS — D649 Anemia, unspecified: Secondary | ICD-10-CM | POA: Diagnosis not present

## 2016-11-29 DIAGNOSIS — E538 Deficiency of other specified B group vitamins: Secondary | ICD-10-CM | POA: Diagnosis not present

## 2016-11-29 DIAGNOSIS — N186 End stage renal disease: Secondary | ICD-10-CM | POA: Diagnosis not present

## 2016-11-29 DIAGNOSIS — N2581 Secondary hyperparathyroidism of renal origin: Secondary | ICD-10-CM | POA: Diagnosis not present

## 2016-11-29 DIAGNOSIS — D631 Anemia in chronic kidney disease: Secondary | ICD-10-CM | POA: Diagnosis not present

## 2016-12-02 DIAGNOSIS — N186 End stage renal disease: Secondary | ICD-10-CM | POA: Diagnosis not present

## 2016-12-02 DIAGNOSIS — D649 Anemia, unspecified: Secondary | ICD-10-CM | POA: Diagnosis not present

## 2016-12-02 DIAGNOSIS — N2581 Secondary hyperparathyroidism of renal origin: Secondary | ICD-10-CM | POA: Diagnosis not present

## 2016-12-02 DIAGNOSIS — D631 Anemia in chronic kidney disease: Secondary | ICD-10-CM | POA: Diagnosis not present

## 2016-12-02 DIAGNOSIS — E538 Deficiency of other specified B group vitamins: Secondary | ICD-10-CM | POA: Diagnosis not present

## 2016-12-03 DIAGNOSIS — T82590A Other mechanical complication of surgically created arteriovenous fistula, initial encounter: Secondary | ICD-10-CM | POA: Diagnosis not present

## 2016-12-04 DIAGNOSIS — N186 End stage renal disease: Secondary | ICD-10-CM | POA: Diagnosis not present

## 2016-12-04 DIAGNOSIS — D631 Anemia in chronic kidney disease: Secondary | ICD-10-CM | POA: Diagnosis not present

## 2016-12-04 DIAGNOSIS — N2581 Secondary hyperparathyroidism of renal origin: Secondary | ICD-10-CM | POA: Diagnosis not present

## 2016-12-05 DIAGNOSIS — F332 Major depressive disorder, recurrent severe without psychotic features: Secondary | ICD-10-CM | POA: Diagnosis not present

## 2016-12-06 DIAGNOSIS — N186 End stage renal disease: Secondary | ICD-10-CM | POA: Diagnosis not present

## 2016-12-06 DIAGNOSIS — N2581 Secondary hyperparathyroidism of renal origin: Secondary | ICD-10-CM | POA: Diagnosis not present

## 2016-12-06 DIAGNOSIS — D631 Anemia in chronic kidney disease: Secondary | ICD-10-CM | POA: Diagnosis not present

## 2016-12-08 DIAGNOSIS — N2581 Secondary hyperparathyroidism of renal origin: Secondary | ICD-10-CM | POA: Diagnosis not present

## 2016-12-08 DIAGNOSIS — N186 End stage renal disease: Secondary | ICD-10-CM | POA: Diagnosis not present

## 2016-12-08 DIAGNOSIS — D631 Anemia in chronic kidney disease: Secondary | ICD-10-CM | POA: Diagnosis not present

## 2016-12-11 DIAGNOSIS — N186 End stage renal disease: Secondary | ICD-10-CM | POA: Diagnosis not present

## 2016-12-11 DIAGNOSIS — N2581 Secondary hyperparathyroidism of renal origin: Secondary | ICD-10-CM | POA: Diagnosis not present

## 2016-12-11 DIAGNOSIS — D631 Anemia in chronic kidney disease: Secondary | ICD-10-CM | POA: Diagnosis not present

## 2016-12-13 DIAGNOSIS — D631 Anemia in chronic kidney disease: Secondary | ICD-10-CM | POA: Diagnosis not present

## 2016-12-13 DIAGNOSIS — F332 Major depressive disorder, recurrent severe without psychotic features: Secondary | ICD-10-CM | POA: Diagnosis not present

## 2016-12-13 DIAGNOSIS — N2581 Secondary hyperparathyroidism of renal origin: Secondary | ICD-10-CM | POA: Diagnosis not present

## 2016-12-13 DIAGNOSIS — N186 End stage renal disease: Secondary | ICD-10-CM | POA: Diagnosis not present

## 2016-12-15 DIAGNOSIS — N186 End stage renal disease: Secondary | ICD-10-CM | POA: Diagnosis not present

## 2016-12-15 DIAGNOSIS — D631 Anemia in chronic kidney disease: Secondary | ICD-10-CM | POA: Diagnosis not present

## 2016-12-15 DIAGNOSIS — N2581 Secondary hyperparathyroidism of renal origin: Secondary | ICD-10-CM | POA: Diagnosis not present

## 2016-12-18 DIAGNOSIS — Z79899 Other long term (current) drug therapy: Secondary | ICD-10-CM | POA: Diagnosis not present

## 2016-12-18 DIAGNOSIS — E785 Hyperlipidemia, unspecified: Secondary | ICD-10-CM | POA: Diagnosis not present

## 2016-12-18 DIAGNOSIS — D631 Anemia in chronic kidney disease: Secondary | ICD-10-CM | POA: Diagnosis not present

## 2016-12-18 DIAGNOSIS — E538 Deficiency of other specified B group vitamins: Secondary | ICD-10-CM | POA: Diagnosis not present

## 2016-12-18 DIAGNOSIS — E46 Unspecified protein-calorie malnutrition: Secondary | ICD-10-CM | POA: Diagnosis not present

## 2016-12-18 DIAGNOSIS — D509 Iron deficiency anemia, unspecified: Secondary | ICD-10-CM | POA: Diagnosis not present

## 2016-12-18 DIAGNOSIS — Z789 Other specified health status: Secondary | ICD-10-CM | POA: Diagnosis not present

## 2016-12-18 DIAGNOSIS — E559 Vitamin D deficiency, unspecified: Secondary | ICD-10-CM | POA: Diagnosis not present

## 2016-12-18 DIAGNOSIS — R748 Abnormal levels of other serum enzymes: Secondary | ICD-10-CM | POA: Diagnosis not present

## 2016-12-18 DIAGNOSIS — D649 Anemia, unspecified: Secondary | ICD-10-CM | POA: Diagnosis not present

## 2016-12-18 DIAGNOSIS — R7989 Other specified abnormal findings of blood chemistry: Secondary | ICD-10-CM | POA: Diagnosis not present

## 2016-12-18 DIAGNOSIS — N186 End stage renal disease: Secondary | ICD-10-CM | POA: Diagnosis not present

## 2016-12-18 DIAGNOSIS — E875 Hyperkalemia: Secondary | ICD-10-CM | POA: Diagnosis not present

## 2016-12-18 DIAGNOSIS — N2581 Secondary hyperparathyroidism of renal origin: Secondary | ICD-10-CM | POA: Diagnosis not present

## 2016-12-20 DIAGNOSIS — N2581 Secondary hyperparathyroidism of renal origin: Secondary | ICD-10-CM | POA: Diagnosis not present

## 2016-12-20 DIAGNOSIS — E538 Deficiency of other specified B group vitamins: Secondary | ICD-10-CM | POA: Diagnosis not present

## 2016-12-20 DIAGNOSIS — N186 End stage renal disease: Secondary | ICD-10-CM | POA: Diagnosis not present

## 2016-12-20 DIAGNOSIS — D649 Anemia, unspecified: Secondary | ICD-10-CM | POA: Diagnosis not present

## 2016-12-20 DIAGNOSIS — R748 Abnormal levels of other serum enzymes: Secondary | ICD-10-CM | POA: Diagnosis not present

## 2016-12-20 DIAGNOSIS — E46 Unspecified protein-calorie malnutrition: Secondary | ICD-10-CM | POA: Diagnosis not present

## 2016-12-20 DIAGNOSIS — D631 Anemia in chronic kidney disease: Secondary | ICD-10-CM | POA: Diagnosis not present

## 2016-12-20 DIAGNOSIS — F332 Major depressive disorder, recurrent severe without psychotic features: Secondary | ICD-10-CM | POA: Diagnosis not present

## 2016-12-20 DIAGNOSIS — D509 Iron deficiency anemia, unspecified: Secondary | ICD-10-CM | POA: Diagnosis not present

## 2016-12-20 DIAGNOSIS — E875 Hyperkalemia: Secondary | ICD-10-CM | POA: Diagnosis not present

## 2016-12-23 DIAGNOSIS — D649 Anemia, unspecified: Secondary | ICD-10-CM | POA: Diagnosis not present

## 2016-12-23 DIAGNOSIS — D631 Anemia in chronic kidney disease: Secondary | ICD-10-CM | POA: Diagnosis not present

## 2016-12-23 DIAGNOSIS — N2581 Secondary hyperparathyroidism of renal origin: Secondary | ICD-10-CM | POA: Diagnosis not present

## 2016-12-23 DIAGNOSIS — E538 Deficiency of other specified B group vitamins: Secondary | ICD-10-CM | POA: Diagnosis not present

## 2016-12-23 DIAGNOSIS — N186 End stage renal disease: Secondary | ICD-10-CM | POA: Diagnosis not present

## 2016-12-25 DIAGNOSIS — N186 End stage renal disease: Secondary | ICD-10-CM | POA: Diagnosis not present

## 2016-12-25 DIAGNOSIS — D649 Anemia, unspecified: Secondary | ICD-10-CM | POA: Diagnosis not present

## 2016-12-25 DIAGNOSIS — E538 Deficiency of other specified B group vitamins: Secondary | ICD-10-CM | POA: Diagnosis not present

## 2016-12-25 DIAGNOSIS — N2581 Secondary hyperparathyroidism of renal origin: Secondary | ICD-10-CM | POA: Diagnosis not present

## 2016-12-25 DIAGNOSIS — D631 Anemia in chronic kidney disease: Secondary | ICD-10-CM | POA: Diagnosis not present

## 2016-12-27 DIAGNOSIS — N2581 Secondary hyperparathyroidism of renal origin: Secondary | ICD-10-CM | POA: Diagnosis not present

## 2016-12-27 DIAGNOSIS — D649 Anemia, unspecified: Secondary | ICD-10-CM | POA: Diagnosis not present

## 2016-12-27 DIAGNOSIS — E538 Deficiency of other specified B group vitamins: Secondary | ICD-10-CM | POA: Diagnosis not present

## 2016-12-27 DIAGNOSIS — F332 Major depressive disorder, recurrent severe without psychotic features: Secondary | ICD-10-CM | POA: Diagnosis not present

## 2016-12-27 DIAGNOSIS — N186 End stage renal disease: Secondary | ICD-10-CM | POA: Diagnosis not present

## 2016-12-27 DIAGNOSIS — D631 Anemia in chronic kidney disease: Secondary | ICD-10-CM | POA: Diagnosis not present

## 2016-12-30 DIAGNOSIS — D649 Anemia, unspecified: Secondary | ICD-10-CM | POA: Diagnosis not present

## 2016-12-30 DIAGNOSIS — N186 End stage renal disease: Secondary | ICD-10-CM | POA: Diagnosis not present

## 2016-12-30 DIAGNOSIS — E538 Deficiency of other specified B group vitamins: Secondary | ICD-10-CM | POA: Diagnosis not present

## 2016-12-30 DIAGNOSIS — N2581 Secondary hyperparathyroidism of renal origin: Secondary | ICD-10-CM | POA: Diagnosis not present

## 2016-12-30 DIAGNOSIS — D631 Anemia in chronic kidney disease: Secondary | ICD-10-CM | POA: Diagnosis not present

## 2017-01-01 DIAGNOSIS — E538 Deficiency of other specified B group vitamins: Secondary | ICD-10-CM | POA: Diagnosis not present

## 2017-01-01 DIAGNOSIS — D649 Anemia, unspecified: Secondary | ICD-10-CM | POA: Diagnosis not present

## 2017-01-01 DIAGNOSIS — N186 End stage renal disease: Secondary | ICD-10-CM | POA: Diagnosis not present

## 2017-01-01 DIAGNOSIS — N2581 Secondary hyperparathyroidism of renal origin: Secondary | ICD-10-CM | POA: Diagnosis not present

## 2017-01-01 DIAGNOSIS — D631 Anemia in chronic kidney disease: Secondary | ICD-10-CM | POA: Diagnosis not present

## 2017-01-03 DIAGNOSIS — D649 Anemia, unspecified: Secondary | ICD-10-CM | POA: Diagnosis not present

## 2017-01-03 DIAGNOSIS — D631 Anemia in chronic kidney disease: Secondary | ICD-10-CM | POA: Diagnosis not present

## 2017-01-03 DIAGNOSIS — F332 Major depressive disorder, recurrent severe without psychotic features: Secondary | ICD-10-CM | POA: Diagnosis not present

## 2017-01-03 DIAGNOSIS — N2581 Secondary hyperparathyroidism of renal origin: Secondary | ICD-10-CM | POA: Diagnosis not present

## 2017-01-03 DIAGNOSIS — N186 End stage renal disease: Secondary | ICD-10-CM | POA: Diagnosis not present

## 2017-01-03 DIAGNOSIS — E538 Deficiency of other specified B group vitamins: Secondary | ICD-10-CM | POA: Diagnosis not present

## 2017-01-06 DIAGNOSIS — D631 Anemia in chronic kidney disease: Secondary | ICD-10-CM | POA: Diagnosis not present

## 2017-01-06 DIAGNOSIS — E538 Deficiency of other specified B group vitamins: Secondary | ICD-10-CM | POA: Diagnosis not present

## 2017-01-06 DIAGNOSIS — N2581 Secondary hyperparathyroidism of renal origin: Secondary | ICD-10-CM | POA: Diagnosis not present

## 2017-01-06 DIAGNOSIS — E46 Unspecified protein-calorie malnutrition: Secondary | ICD-10-CM | POA: Diagnosis not present

## 2017-01-06 DIAGNOSIS — E875 Hyperkalemia: Secondary | ICD-10-CM | POA: Diagnosis not present

## 2017-01-06 DIAGNOSIS — D649 Anemia, unspecified: Secondary | ICD-10-CM | POA: Diagnosis not present

## 2017-01-06 DIAGNOSIS — N186 End stage renal disease: Secondary | ICD-10-CM | POA: Diagnosis not present

## 2017-01-06 DIAGNOSIS — D509 Iron deficiency anemia, unspecified: Secondary | ICD-10-CM | POA: Diagnosis not present

## 2017-01-06 DIAGNOSIS — R748 Abnormal levels of other serum enzymes: Secondary | ICD-10-CM | POA: Diagnosis not present

## 2017-01-08 DIAGNOSIS — K660 Peritoneal adhesions (postprocedural) (postinfection): Secondary | ICD-10-CM | POA: Diagnosis not present

## 2017-01-08 DIAGNOSIS — K8010 Calculus of gallbladder with chronic cholecystitis without obstruction: Secondary | ICD-10-CM | POA: Diagnosis not present

## 2017-01-08 DIAGNOSIS — I132 Hypertensive heart and chronic kidney disease with heart failure and with stage 5 chronic kidney disease, or end stage renal disease: Secondary | ICD-10-CM | POA: Diagnosis present

## 2017-01-08 DIAGNOSIS — E538 Deficiency of other specified B group vitamins: Secondary | ICD-10-CM | POA: Diagnosis not present

## 2017-01-08 DIAGNOSIS — N2581 Secondary hyperparathyroidism of renal origin: Secondary | ICD-10-CM | POA: Diagnosis not present

## 2017-01-08 DIAGNOSIS — E1165 Type 2 diabetes mellitus with hyperglycemia: Secondary | ICD-10-CM | POA: Diagnosis present

## 2017-01-08 DIAGNOSIS — E1122 Type 2 diabetes mellitus with diabetic chronic kidney disease: Secondary | ICD-10-CM | POA: Diagnosis not present

## 2017-01-08 DIAGNOSIS — D649 Anemia, unspecified: Secondary | ICD-10-CM | POA: Diagnosis not present

## 2017-01-08 DIAGNOSIS — K5900 Constipation, unspecified: Secondary | ICD-10-CM | POA: Diagnosis not present

## 2017-01-08 DIAGNOSIS — N179 Acute kidney failure, unspecified: Secondary | ICD-10-CM | POA: Diagnosis not present

## 2017-01-08 DIAGNOSIS — D631 Anemia in chronic kidney disease: Secondary | ICD-10-CM | POA: Diagnosis not present

## 2017-01-08 DIAGNOSIS — G4733 Obstructive sleep apnea (adult) (pediatric): Secondary | ICD-10-CM | POA: Diagnosis present

## 2017-01-08 DIAGNOSIS — R1031 Right lower quadrant pain: Secondary | ICD-10-CM | POA: Diagnosis not present

## 2017-01-08 DIAGNOSIS — Z6841 Body Mass Index (BMI) 40.0 and over, adult: Secondary | ICD-10-CM | POA: Diagnosis not present

## 2017-01-08 DIAGNOSIS — I440 Atrioventricular block, first degree: Secondary | ICD-10-CM | POA: Diagnosis not present

## 2017-01-08 DIAGNOSIS — N186 End stage renal disease: Secondary | ICD-10-CM | POA: Diagnosis not present

## 2017-01-08 DIAGNOSIS — K8080 Other cholelithiasis without obstruction: Secondary | ICD-10-CM | POA: Diagnosis not present

## 2017-01-08 DIAGNOSIS — Z9889 Other specified postprocedural states: Secondary | ICD-10-CM | POA: Diagnosis not present

## 2017-01-08 DIAGNOSIS — E213 Hyperparathyroidism, unspecified: Secondary | ICD-10-CM | POA: Diagnosis present

## 2017-01-08 DIAGNOSIS — Z992 Dependence on renal dialysis: Secondary | ICD-10-CM | POA: Diagnosis not present

## 2017-01-08 DIAGNOSIS — I5032 Chronic diastolic (congestive) heart failure: Secondary | ICD-10-CM | POA: Diagnosis present

## 2017-01-08 DIAGNOSIS — E875 Hyperkalemia: Secondary | ICD-10-CM | POA: Diagnosis present

## 2017-01-15 DIAGNOSIS — N186 End stage renal disease: Secondary | ICD-10-CM | POA: Diagnosis not present

## 2017-01-17 DIAGNOSIS — R748 Abnormal levels of other serum enzymes: Secondary | ICD-10-CM | POA: Diagnosis not present

## 2017-01-17 DIAGNOSIS — E538 Deficiency of other specified B group vitamins: Secondary | ICD-10-CM | POA: Diagnosis not present

## 2017-01-17 DIAGNOSIS — D509 Iron deficiency anemia, unspecified: Secondary | ICD-10-CM | POA: Diagnosis not present

## 2017-01-17 DIAGNOSIS — N186 End stage renal disease: Secondary | ICD-10-CM | POA: Diagnosis not present

## 2017-01-17 DIAGNOSIS — E46 Unspecified protein-calorie malnutrition: Secondary | ICD-10-CM | POA: Diagnosis not present

## 2017-01-17 DIAGNOSIS — N2581 Secondary hyperparathyroidism of renal origin: Secondary | ICD-10-CM | POA: Diagnosis not present

## 2017-01-17 DIAGNOSIS — D631 Anemia in chronic kidney disease: Secondary | ICD-10-CM | POA: Diagnosis not present

## 2017-01-17 DIAGNOSIS — E875 Hyperkalemia: Secondary | ICD-10-CM | POA: Diagnosis not present

## 2017-01-17 DIAGNOSIS — D649 Anemia, unspecified: Secondary | ICD-10-CM | POA: Diagnosis not present

## 2017-01-17 DIAGNOSIS — F332 Major depressive disorder, recurrent severe without psychotic features: Secondary | ICD-10-CM | POA: Diagnosis not present

## 2017-01-20 DIAGNOSIS — N2581 Secondary hyperparathyroidism of renal origin: Secondary | ICD-10-CM | POA: Diagnosis not present

## 2017-01-20 DIAGNOSIS — D631 Anemia in chronic kidney disease: Secondary | ICD-10-CM | POA: Diagnosis not present

## 2017-01-20 DIAGNOSIS — D649 Anemia, unspecified: Secondary | ICD-10-CM | POA: Diagnosis not present

## 2017-01-20 DIAGNOSIS — E538 Deficiency of other specified B group vitamins: Secondary | ICD-10-CM | POA: Diagnosis not present

## 2017-01-20 DIAGNOSIS — N186 End stage renal disease: Secondary | ICD-10-CM | POA: Diagnosis not present

## 2017-01-22 DIAGNOSIS — E785 Hyperlipidemia, unspecified: Secondary | ICD-10-CM | POA: Diagnosis not present

## 2017-01-22 DIAGNOSIS — Z79899 Other long term (current) drug therapy: Secondary | ICD-10-CM | POA: Diagnosis not present

## 2017-01-22 DIAGNOSIS — D649 Anemia, unspecified: Secondary | ICD-10-CM | POA: Diagnosis not present

## 2017-01-22 DIAGNOSIS — E538 Deficiency of other specified B group vitamins: Secondary | ICD-10-CM | POA: Diagnosis not present

## 2017-01-22 DIAGNOSIS — N2581 Secondary hyperparathyroidism of renal origin: Secondary | ICD-10-CM | POA: Diagnosis not present

## 2017-01-22 DIAGNOSIS — R7989 Other specified abnormal findings of blood chemistry: Secondary | ICD-10-CM | POA: Diagnosis not present

## 2017-01-22 DIAGNOSIS — N186 End stage renal disease: Secondary | ICD-10-CM | POA: Diagnosis not present

## 2017-01-22 DIAGNOSIS — D631 Anemia in chronic kidney disease: Secondary | ICD-10-CM | POA: Diagnosis not present

## 2017-01-24 DIAGNOSIS — N2581 Secondary hyperparathyroidism of renal origin: Secondary | ICD-10-CM | POA: Diagnosis not present

## 2017-01-24 DIAGNOSIS — E538 Deficiency of other specified B group vitamins: Secondary | ICD-10-CM | POA: Diagnosis not present

## 2017-01-24 DIAGNOSIS — N186 End stage renal disease: Secondary | ICD-10-CM | POA: Diagnosis not present

## 2017-01-24 DIAGNOSIS — D649 Anemia, unspecified: Secondary | ICD-10-CM | POA: Diagnosis not present

## 2017-01-24 DIAGNOSIS — F332 Major depressive disorder, recurrent severe without psychotic features: Secondary | ICD-10-CM | POA: Diagnosis not present

## 2017-01-24 DIAGNOSIS — D631 Anemia in chronic kidney disease: Secondary | ICD-10-CM | POA: Diagnosis not present

## 2017-01-27 DIAGNOSIS — D631 Anemia in chronic kidney disease: Secondary | ICD-10-CM | POA: Diagnosis not present

## 2017-01-27 DIAGNOSIS — N186 End stage renal disease: Secondary | ICD-10-CM | POA: Diagnosis not present

## 2017-01-27 DIAGNOSIS — E538 Deficiency of other specified B group vitamins: Secondary | ICD-10-CM | POA: Diagnosis not present

## 2017-01-27 DIAGNOSIS — D649 Anemia, unspecified: Secondary | ICD-10-CM | POA: Diagnosis not present

## 2017-01-27 DIAGNOSIS — N2581 Secondary hyperparathyroidism of renal origin: Secondary | ICD-10-CM | POA: Diagnosis not present

## 2017-01-28 DIAGNOSIS — Z4889 Encounter for other specified surgical aftercare: Secondary | ICD-10-CM | POA: Diagnosis not present

## 2017-01-29 DIAGNOSIS — N2581 Secondary hyperparathyroidism of renal origin: Secondary | ICD-10-CM | POA: Diagnosis not present

## 2017-01-29 DIAGNOSIS — D649 Anemia, unspecified: Secondary | ICD-10-CM | POA: Diagnosis not present

## 2017-01-29 DIAGNOSIS — E538 Deficiency of other specified B group vitamins: Secondary | ICD-10-CM | POA: Diagnosis not present

## 2017-01-29 DIAGNOSIS — D631 Anemia in chronic kidney disease: Secondary | ICD-10-CM | POA: Diagnosis not present

## 2017-01-29 DIAGNOSIS — N186 End stage renal disease: Secondary | ICD-10-CM | POA: Diagnosis not present

## 2017-01-31 DIAGNOSIS — D649 Anemia, unspecified: Secondary | ICD-10-CM | POA: Diagnosis not present

## 2017-01-31 DIAGNOSIS — E538 Deficiency of other specified B group vitamins: Secondary | ICD-10-CM | POA: Diagnosis not present

## 2017-01-31 DIAGNOSIS — N186 End stage renal disease: Secondary | ICD-10-CM | POA: Diagnosis not present

## 2017-01-31 DIAGNOSIS — N2581 Secondary hyperparathyroidism of renal origin: Secondary | ICD-10-CM | POA: Diagnosis not present

## 2017-01-31 DIAGNOSIS — D631 Anemia in chronic kidney disease: Secondary | ICD-10-CM | POA: Diagnosis not present

## 2017-02-03 DIAGNOSIS — E538 Deficiency of other specified B group vitamins: Secondary | ICD-10-CM | POA: Diagnosis not present

## 2017-02-03 DIAGNOSIS — N186 End stage renal disease: Secondary | ICD-10-CM | POA: Diagnosis not present

## 2017-02-03 DIAGNOSIS — N2581 Secondary hyperparathyroidism of renal origin: Secondary | ICD-10-CM | POA: Diagnosis not present

## 2017-02-03 DIAGNOSIS — D649 Anemia, unspecified: Secondary | ICD-10-CM | POA: Diagnosis not present

## 2017-02-03 DIAGNOSIS — D631 Anemia in chronic kidney disease: Secondary | ICD-10-CM | POA: Diagnosis not present

## 2017-02-04 DIAGNOSIS — F332 Major depressive disorder, recurrent severe without psychotic features: Secondary | ICD-10-CM | POA: Diagnosis not present

## 2017-02-05 DIAGNOSIS — N186 End stage renal disease: Secondary | ICD-10-CM | POA: Diagnosis not present

## 2017-02-05 DIAGNOSIS — D631 Anemia in chronic kidney disease: Secondary | ICD-10-CM | POA: Diagnosis not present

## 2017-02-05 DIAGNOSIS — E538 Deficiency of other specified B group vitamins: Secondary | ICD-10-CM | POA: Diagnosis not present

## 2017-02-05 DIAGNOSIS — N2581 Secondary hyperparathyroidism of renal origin: Secondary | ICD-10-CM | POA: Diagnosis not present

## 2017-02-05 DIAGNOSIS — D649 Anemia, unspecified: Secondary | ICD-10-CM | POA: Diagnosis not present

## 2017-02-07 DIAGNOSIS — D649 Anemia, unspecified: Secondary | ICD-10-CM | POA: Diagnosis not present

## 2017-02-07 DIAGNOSIS — N186 End stage renal disease: Secondary | ICD-10-CM | POA: Diagnosis not present

## 2017-02-07 DIAGNOSIS — D631 Anemia in chronic kidney disease: Secondary | ICD-10-CM | POA: Diagnosis not present

## 2017-02-07 DIAGNOSIS — E538 Deficiency of other specified B group vitamins: Secondary | ICD-10-CM | POA: Diagnosis not present

## 2017-02-07 DIAGNOSIS — N2581 Secondary hyperparathyroidism of renal origin: Secondary | ICD-10-CM | POA: Diagnosis not present

## 2017-02-08 DIAGNOSIS — R05 Cough: Secondary | ICD-10-CM | POA: Diagnosis not present

## 2017-02-10 DIAGNOSIS — N2581 Secondary hyperparathyroidism of renal origin: Secondary | ICD-10-CM | POA: Diagnosis not present

## 2017-02-10 DIAGNOSIS — E538 Deficiency of other specified B group vitamins: Secondary | ICD-10-CM | POA: Diagnosis not present

## 2017-02-10 DIAGNOSIS — D631 Anemia in chronic kidney disease: Secondary | ICD-10-CM | POA: Diagnosis not present

## 2017-02-10 DIAGNOSIS — D649 Anemia, unspecified: Secondary | ICD-10-CM | POA: Diagnosis not present

## 2017-02-10 DIAGNOSIS — N186 End stage renal disease: Secondary | ICD-10-CM | POA: Diagnosis not present

## 2017-02-12 DIAGNOSIS — N186 End stage renal disease: Secondary | ICD-10-CM | POA: Diagnosis not present

## 2017-02-19 DIAGNOSIS — N186 End stage renal disease: Secondary | ICD-10-CM | POA: Diagnosis not present

## 2017-02-19 DIAGNOSIS — D631 Anemia in chronic kidney disease: Secondary | ICD-10-CM | POA: Diagnosis not present

## 2017-02-19 DIAGNOSIS — N2581 Secondary hyperparathyroidism of renal origin: Secondary | ICD-10-CM | POA: Diagnosis not present

## 2017-02-19 DIAGNOSIS — E1129 Type 2 diabetes mellitus with other diabetic kidney complication: Secondary | ICD-10-CM | POA: Diagnosis not present

## 2017-02-20 DIAGNOSIS — N186 End stage renal disease: Secondary | ICD-10-CM | POA: Diagnosis not present

## 2017-02-20 DIAGNOSIS — D631 Anemia in chronic kidney disease: Secondary | ICD-10-CM | POA: Diagnosis not present

## 2017-02-20 DIAGNOSIS — E1129 Type 2 diabetes mellitus with other diabetic kidney complication: Secondary | ICD-10-CM | POA: Diagnosis not present

## 2017-02-20 DIAGNOSIS — N2581 Secondary hyperparathyroidism of renal origin: Secondary | ICD-10-CM | POA: Diagnosis not present

## 2017-02-22 DIAGNOSIS — N2581 Secondary hyperparathyroidism of renal origin: Secondary | ICD-10-CM | POA: Diagnosis not present

## 2017-02-22 DIAGNOSIS — E1129 Type 2 diabetes mellitus with other diabetic kidney complication: Secondary | ICD-10-CM | POA: Diagnosis not present

## 2017-02-22 DIAGNOSIS — N186 End stage renal disease: Secondary | ICD-10-CM | POA: Diagnosis not present

## 2017-02-22 DIAGNOSIS — D631 Anemia in chronic kidney disease: Secondary | ICD-10-CM | POA: Diagnosis not present

## 2017-02-25 DIAGNOSIS — N2581 Secondary hyperparathyroidism of renal origin: Secondary | ICD-10-CM | POA: Diagnosis not present

## 2017-02-25 DIAGNOSIS — E1129 Type 2 diabetes mellitus with other diabetic kidney complication: Secondary | ICD-10-CM | POA: Diagnosis not present

## 2017-02-25 DIAGNOSIS — D631 Anemia in chronic kidney disease: Secondary | ICD-10-CM | POA: Diagnosis not present

## 2017-02-25 DIAGNOSIS — N186 End stage renal disease: Secondary | ICD-10-CM | POA: Diagnosis not present

## 2017-02-27 DIAGNOSIS — E1129 Type 2 diabetes mellitus with other diabetic kidney complication: Secondary | ICD-10-CM | POA: Diagnosis not present

## 2017-02-27 DIAGNOSIS — N186 End stage renal disease: Secondary | ICD-10-CM | POA: Diagnosis not present

## 2017-02-27 DIAGNOSIS — E569 Vitamin deficiency, unspecified: Secondary | ICD-10-CM | POA: Diagnosis not present

## 2017-02-27 DIAGNOSIS — N2581 Secondary hyperparathyroidism of renal origin: Secondary | ICD-10-CM | POA: Diagnosis not present

## 2017-03-01 DIAGNOSIS — N2581 Secondary hyperparathyroidism of renal origin: Secondary | ICD-10-CM | POA: Diagnosis not present

## 2017-03-01 DIAGNOSIS — N186 End stage renal disease: Secondary | ICD-10-CM | POA: Diagnosis not present

## 2017-03-01 DIAGNOSIS — E1129 Type 2 diabetes mellitus with other diabetic kidney complication: Secondary | ICD-10-CM | POA: Diagnosis not present

## 2017-03-01 DIAGNOSIS — E569 Vitamin deficiency, unspecified: Secondary | ICD-10-CM | POA: Diagnosis not present

## 2017-03-04 DIAGNOSIS — N186 End stage renal disease: Secondary | ICD-10-CM | POA: Diagnosis not present

## 2017-03-04 DIAGNOSIS — E1129 Type 2 diabetes mellitus with other diabetic kidney complication: Secondary | ICD-10-CM | POA: Diagnosis not present

## 2017-03-04 DIAGNOSIS — E569 Vitamin deficiency, unspecified: Secondary | ICD-10-CM | POA: Diagnosis not present

## 2017-03-04 DIAGNOSIS — N2581 Secondary hyperparathyroidism of renal origin: Secondary | ICD-10-CM | POA: Diagnosis not present

## 2017-03-06 DIAGNOSIS — N186 End stage renal disease: Secondary | ICD-10-CM | POA: Diagnosis not present

## 2017-03-06 DIAGNOSIS — N2581 Secondary hyperparathyroidism of renal origin: Secondary | ICD-10-CM | POA: Diagnosis not present

## 2017-03-06 DIAGNOSIS — E1129 Type 2 diabetes mellitus with other diabetic kidney complication: Secondary | ICD-10-CM | POA: Diagnosis not present

## 2017-03-06 DIAGNOSIS — E569 Vitamin deficiency, unspecified: Secondary | ICD-10-CM | POA: Diagnosis not present

## 2017-03-08 DIAGNOSIS — E569 Vitamin deficiency, unspecified: Secondary | ICD-10-CM | POA: Diagnosis not present

## 2017-03-08 DIAGNOSIS — N186 End stage renal disease: Secondary | ICD-10-CM | POA: Diagnosis not present

## 2017-03-08 DIAGNOSIS — E1129 Type 2 diabetes mellitus with other diabetic kidney complication: Secondary | ICD-10-CM | POA: Diagnosis not present

## 2017-03-08 DIAGNOSIS — N2581 Secondary hyperparathyroidism of renal origin: Secondary | ICD-10-CM | POA: Diagnosis not present

## 2017-03-10 ENCOUNTER — Ambulatory Visit
Admission: RE | Admit: 2017-03-10 | Discharge: 2017-03-10 | Disposition: A | Discharge status: Home / Self Care | Payer: Medicare Other | Source: Ambulatory Visit | Attending: Nephrology | Admitting: Nephrology

## 2017-03-10 ENCOUNTER — Other Ambulatory Visit: Payer: Self-pay | Admitting: Nephrology

## 2017-03-10 DIAGNOSIS — R059 Cough, unspecified: Secondary | ICD-10-CM

## 2017-03-10 DIAGNOSIS — R05 Cough: Secondary | ICD-10-CM

## 2017-03-11 DIAGNOSIS — N186 End stage renal disease: Secondary | ICD-10-CM | POA: Diagnosis not present

## 2017-03-11 DIAGNOSIS — E1129 Type 2 diabetes mellitus with other diabetic kidney complication: Secondary | ICD-10-CM | POA: Diagnosis not present

## 2017-03-11 DIAGNOSIS — N2581 Secondary hyperparathyroidism of renal origin: Secondary | ICD-10-CM | POA: Diagnosis not present

## 2017-03-11 DIAGNOSIS — E569 Vitamin deficiency, unspecified: Secondary | ICD-10-CM | POA: Diagnosis not present

## 2017-03-13 DIAGNOSIS — N2581 Secondary hyperparathyroidism of renal origin: Secondary | ICD-10-CM | POA: Diagnosis not present

## 2017-03-13 DIAGNOSIS — E1129 Type 2 diabetes mellitus with other diabetic kidney complication: Secondary | ICD-10-CM | POA: Diagnosis not present

## 2017-03-13 DIAGNOSIS — N186 End stage renal disease: Secondary | ICD-10-CM | POA: Diagnosis not present

## 2017-03-13 DIAGNOSIS — E569 Vitamin deficiency, unspecified: Secondary | ICD-10-CM | POA: Diagnosis not present

## 2017-03-15 DIAGNOSIS — N2581 Secondary hyperparathyroidism of renal origin: Secondary | ICD-10-CM | POA: Diagnosis not present

## 2017-03-15 DIAGNOSIS — E569 Vitamin deficiency, unspecified: Secondary | ICD-10-CM | POA: Diagnosis not present

## 2017-03-15 DIAGNOSIS — N186 End stage renal disease: Secondary | ICD-10-CM | POA: Diagnosis not present

## 2017-03-15 DIAGNOSIS — I158 Other secondary hypertension: Secondary | ICD-10-CM | POA: Diagnosis not present

## 2017-03-15 DIAGNOSIS — Z992 Dependence on renal dialysis: Secondary | ICD-10-CM | POA: Diagnosis not present

## 2017-03-15 DIAGNOSIS — E1129 Type 2 diabetes mellitus with other diabetic kidney complication: Secondary | ICD-10-CM | POA: Diagnosis not present

## 2017-03-17 DIAGNOSIS — D509 Iron deficiency anemia, unspecified: Secondary | ICD-10-CM | POA: Diagnosis not present

## 2017-03-17 DIAGNOSIS — E1129 Type 2 diabetes mellitus with other diabetic kidney complication: Secondary | ICD-10-CM | POA: Diagnosis not present

## 2017-03-17 DIAGNOSIS — N2581 Secondary hyperparathyroidism of renal origin: Secondary | ICD-10-CM | POA: Diagnosis not present

## 2017-03-17 DIAGNOSIS — N186 End stage renal disease: Secondary | ICD-10-CM | POA: Diagnosis not present

## 2017-03-19 DIAGNOSIS — D509 Iron deficiency anemia, unspecified: Secondary | ICD-10-CM | POA: Diagnosis not present

## 2017-03-19 DIAGNOSIS — N2581 Secondary hyperparathyroidism of renal origin: Secondary | ICD-10-CM | POA: Diagnosis not present

## 2017-03-19 DIAGNOSIS — N186 End stage renal disease: Secondary | ICD-10-CM | POA: Diagnosis not present

## 2017-03-19 DIAGNOSIS — E1129 Type 2 diabetes mellitus with other diabetic kidney complication: Secondary | ICD-10-CM | POA: Diagnosis not present

## 2017-03-21 ENCOUNTER — Ambulatory Visit: Payer: Self-pay | Admitting: Podiatry

## 2017-03-21 DIAGNOSIS — N186 End stage renal disease: Secondary | ICD-10-CM | POA: Diagnosis not present

## 2017-03-21 DIAGNOSIS — N2581 Secondary hyperparathyroidism of renal origin: Secondary | ICD-10-CM | POA: Diagnosis not present

## 2017-03-21 DIAGNOSIS — D509 Iron deficiency anemia, unspecified: Secondary | ICD-10-CM | POA: Diagnosis not present

## 2017-03-21 DIAGNOSIS — E1129 Type 2 diabetes mellitus with other diabetic kidney complication: Secondary | ICD-10-CM | POA: Diagnosis not present

## 2017-03-24 DIAGNOSIS — D509 Iron deficiency anemia, unspecified: Secondary | ICD-10-CM | POA: Diagnosis not present

## 2017-03-24 DIAGNOSIS — E1129 Type 2 diabetes mellitus with other diabetic kidney complication: Secondary | ICD-10-CM | POA: Diagnosis not present

## 2017-03-24 DIAGNOSIS — N186 End stage renal disease: Secondary | ICD-10-CM | POA: Diagnosis not present

## 2017-03-24 DIAGNOSIS — N2581 Secondary hyperparathyroidism of renal origin: Secondary | ICD-10-CM | POA: Diagnosis not present

## 2017-03-26 DIAGNOSIS — N2581 Secondary hyperparathyroidism of renal origin: Secondary | ICD-10-CM | POA: Diagnosis not present

## 2017-03-26 DIAGNOSIS — E1129 Type 2 diabetes mellitus with other diabetic kidney complication: Secondary | ICD-10-CM | POA: Diagnosis not present

## 2017-03-26 DIAGNOSIS — N186 End stage renal disease: Secondary | ICD-10-CM | POA: Diagnosis not present

## 2017-03-26 DIAGNOSIS — D509 Iron deficiency anemia, unspecified: Secondary | ICD-10-CM | POA: Diagnosis not present

## 2017-03-28 DIAGNOSIS — N186 End stage renal disease: Secondary | ICD-10-CM | POA: Diagnosis not present

## 2017-03-28 DIAGNOSIS — E1129 Type 2 diabetes mellitus with other diabetic kidney complication: Secondary | ICD-10-CM | POA: Diagnosis not present

## 2017-03-28 DIAGNOSIS — D509 Iron deficiency anemia, unspecified: Secondary | ICD-10-CM | POA: Diagnosis not present

## 2017-03-28 DIAGNOSIS — N2581 Secondary hyperparathyroidism of renal origin: Secondary | ICD-10-CM | POA: Diagnosis not present

## 2017-03-31 DIAGNOSIS — N186 End stage renal disease: Secondary | ICD-10-CM | POA: Diagnosis not present

## 2017-03-31 DIAGNOSIS — D509 Iron deficiency anemia, unspecified: Secondary | ICD-10-CM | POA: Diagnosis not present

## 2017-03-31 DIAGNOSIS — E1129 Type 2 diabetes mellitus with other diabetic kidney complication: Secondary | ICD-10-CM | POA: Diagnosis not present

## 2017-03-31 DIAGNOSIS — N2581 Secondary hyperparathyroidism of renal origin: Secondary | ICD-10-CM | POA: Diagnosis not present

## 2017-04-02 DIAGNOSIS — N2581 Secondary hyperparathyroidism of renal origin: Secondary | ICD-10-CM | POA: Diagnosis not present

## 2017-04-02 DIAGNOSIS — D509 Iron deficiency anemia, unspecified: Secondary | ICD-10-CM | POA: Diagnosis not present

## 2017-04-02 DIAGNOSIS — N186 End stage renal disease: Secondary | ICD-10-CM | POA: Diagnosis not present

## 2017-04-02 DIAGNOSIS — E1129 Type 2 diabetes mellitus with other diabetic kidney complication: Secondary | ICD-10-CM | POA: Diagnosis not present

## 2017-04-03 ENCOUNTER — Encounter: Payer: Self-pay | Admitting: Podiatry

## 2017-04-03 ENCOUNTER — Ambulatory Visit (INDEPENDENT_AMBULATORY_CARE_PROVIDER_SITE_OTHER): Payer: Medicare Other | Admitting: Podiatry

## 2017-04-03 DIAGNOSIS — E1151 Type 2 diabetes mellitus with diabetic peripheral angiopathy without gangrene: Secondary | ICD-10-CM | POA: Diagnosis not present

## 2017-04-03 DIAGNOSIS — B351 Tinea unguium: Secondary | ICD-10-CM

## 2017-04-03 DIAGNOSIS — M79676 Pain in unspecified toe(s): Secondary | ICD-10-CM | POA: Diagnosis not present

## 2017-04-03 NOTE — Progress Notes (Signed)
   Subjective:    Patient ID: Caitlin Stone, female    DOB: [birth year], 66 y.o.   MRN: [id number]  HPI this patient presents the office for an evaluation and treatment of both feet. This patient states that she is diabetic and has neuropathy. She says the neuropathy causes her to have pain, but she has taken gabapentin and Lyrica and neither of them have proven effective. She says states that she is on kidney dialysis. She initially was referred to this office due to significant peeling of the skin on the bottom of both feet. At today's visit there is minimal peeling noted to her feet. She does present to  the office today stating that the great toenails on both feet are painful walking and wearing her shoes. She presents the office today for an evaluation of her diabetic feet    Review of Systems  All other systems reviewed and are negative.      Objective:   Physical Exam GENERAL APPEARANCE: Alert, conversant. Appropriately groomed. No acute distress.  VASCULAR: Pedal pulses are  Barely  palpable at  [facility] and PT bilateral.  Capillary refill time is immediate to all digits,  Normal temperature gradient.  Digital hair growth is present bilateral  NEUROLOGIC: sensation is normal to 5.07 monofilament at 5/5 sites bilateral.  Light touch is intact bilateral, Muscle strength normal.  MUSCULOSKELETAL: acceptable muscle strength, tone and stability bilateral.  Intrinsic muscluature intact bilateral.  Rectus appearance of foot and digits noted bilateral. Severe foot swelling npted on dorsum of feet.  DERMATOLOGIC: skin color, texture, and turgor are within normal limits.  No preulcerative lesions or ulcers  are seen, no interdigital maceration noted.  No open lesions present.   No drainage noted.Peeling right heel noted.  NAILS  Thick disfigured discolored nails both great toes.            Assessment & Plan:  Onychomycosis  B/L  Diabetes with angiopathy.  IE  Debride nails.  Told the patient  that she could use Vaseline for the remaining peeling on the bottom of both feet RTC 3 months   Gardiner Barefoot DPM

## 2017-04-04 DIAGNOSIS — E1129 Type 2 diabetes mellitus with other diabetic kidney complication: Secondary | ICD-10-CM | POA: Diagnosis not present

## 2017-04-04 DIAGNOSIS — D509 Iron deficiency anemia, unspecified: Secondary | ICD-10-CM | POA: Diagnosis not present

## 2017-04-04 DIAGNOSIS — N186 End stage renal disease: Secondary | ICD-10-CM | POA: Diagnosis not present

## 2017-04-04 DIAGNOSIS — N2581 Secondary hyperparathyroidism of renal origin: Secondary | ICD-10-CM | POA: Diagnosis not present

## 2017-04-07 DIAGNOSIS — D509 Iron deficiency anemia, unspecified: Secondary | ICD-10-CM | POA: Diagnosis not present

## 2017-04-07 DIAGNOSIS — E1129 Type 2 diabetes mellitus with other diabetic kidney complication: Secondary | ICD-10-CM | POA: Diagnosis not present

## 2017-04-07 DIAGNOSIS — N2581 Secondary hyperparathyroidism of renal origin: Secondary | ICD-10-CM | POA: Diagnosis not present

## 2017-04-07 DIAGNOSIS — N186 End stage renal disease: Secondary | ICD-10-CM | POA: Diagnosis not present

## 2017-04-09 DIAGNOSIS — N186 End stage renal disease: Secondary | ICD-10-CM | POA: Diagnosis not present

## 2017-04-09 DIAGNOSIS — D509 Iron deficiency anemia, unspecified: Secondary | ICD-10-CM | POA: Diagnosis not present

## 2017-04-09 DIAGNOSIS — E1129 Type 2 diabetes mellitus with other diabetic kidney complication: Secondary | ICD-10-CM | POA: Diagnosis not present

## 2017-04-09 DIAGNOSIS — N2581 Secondary hyperparathyroidism of renal origin: Secondary | ICD-10-CM | POA: Diagnosis not present

## 2017-04-10 DIAGNOSIS — Z992 Dependence on renal dialysis: Secondary | ICD-10-CM | POA: Diagnosis not present

## 2017-04-10 DIAGNOSIS — J441 Chronic obstructive pulmonary disease with (acute) exacerbation: Secondary | ICD-10-CM | POA: Diagnosis not present

## 2017-04-10 DIAGNOSIS — K210 Gastro-esophageal reflux disease with esophagitis: Secondary | ICD-10-CM | POA: Diagnosis not present

## 2017-04-10 DIAGNOSIS — E0822 Diabetes mellitus due to underlying condition with diabetic chronic kidney disease: Secondary | ICD-10-CM | POA: Diagnosis not present

## 2017-04-10 DIAGNOSIS — I120 Hypertensive chronic kidney disease with stage 5 chronic kidney disease or end stage renal disease: Secondary | ICD-10-CM | POA: Diagnosis not present

## 2017-04-11 DIAGNOSIS — N186 End stage renal disease: Secondary | ICD-10-CM | POA: Diagnosis not present

## 2017-04-11 DIAGNOSIS — E1129 Type 2 diabetes mellitus with other diabetic kidney complication: Secondary | ICD-10-CM | POA: Diagnosis not present

## 2017-04-11 DIAGNOSIS — N2581 Secondary hyperparathyroidism of renal origin: Secondary | ICD-10-CM | POA: Diagnosis not present

## 2017-04-11 DIAGNOSIS — D509 Iron deficiency anemia, unspecified: Secondary | ICD-10-CM | POA: Diagnosis not present

## 2017-04-14 DIAGNOSIS — N2581 Secondary hyperparathyroidism of renal origin: Secondary | ICD-10-CM | POA: Diagnosis not present

## 2017-04-14 DIAGNOSIS — E1129 Type 2 diabetes mellitus with other diabetic kidney complication: Secondary | ICD-10-CM | POA: Diagnosis not present

## 2017-04-14 DIAGNOSIS — N186 End stage renal disease: Secondary | ICD-10-CM | POA: Diagnosis not present

## 2017-04-14 DIAGNOSIS — I158 Other secondary hypertension: Secondary | ICD-10-CM | POA: Diagnosis not present

## 2017-04-14 DIAGNOSIS — D509 Iron deficiency anemia, unspecified: Secondary | ICD-10-CM | POA: Diagnosis not present

## 2017-04-14 DIAGNOSIS — Z992 Dependence on renal dialysis: Secondary | ICD-10-CM | POA: Diagnosis not present

## 2017-04-16 DIAGNOSIS — E1129 Type 2 diabetes mellitus with other diabetic kidney complication: Secondary | ICD-10-CM | POA: Diagnosis not present

## 2017-04-16 DIAGNOSIS — N2581 Secondary hyperparathyroidism of renal origin: Secondary | ICD-10-CM | POA: Diagnosis not present

## 2017-04-16 DIAGNOSIS — N186 End stage renal disease: Secondary | ICD-10-CM | POA: Diagnosis not present

## 2017-04-18 DIAGNOSIS — E1129 Type 2 diabetes mellitus with other diabetic kidney complication: Secondary | ICD-10-CM | POA: Diagnosis not present

## 2017-04-18 DIAGNOSIS — N2581 Secondary hyperparathyroidism of renal origin: Secondary | ICD-10-CM | POA: Diagnosis not present

## 2017-04-18 DIAGNOSIS — N186 End stage renal disease: Secondary | ICD-10-CM | POA: Diagnosis not present

## 2017-04-21 DIAGNOSIS — N186 End stage renal disease: Secondary | ICD-10-CM | POA: Diagnosis not present

## 2017-04-21 DIAGNOSIS — E1129 Type 2 diabetes mellitus with other diabetic kidney complication: Secondary | ICD-10-CM | POA: Diagnosis not present

## 2017-04-21 DIAGNOSIS — N2581 Secondary hyperparathyroidism of renal origin: Secondary | ICD-10-CM | POA: Diagnosis not present

## 2017-04-22 DIAGNOSIS — I120 Hypertensive chronic kidney disease with stage 5 chronic kidney disease or end stage renal disease: Secondary | ICD-10-CM | POA: Diagnosis not present

## 2017-04-22 DIAGNOSIS — K210 Gastro-esophageal reflux disease with esophagitis: Secondary | ICD-10-CM | POA: Diagnosis not present

## 2017-04-22 DIAGNOSIS — E0822 Diabetes mellitus due to underlying condition with diabetic chronic kidney disease: Secondary | ICD-10-CM | POA: Diagnosis not present

## 2017-04-23 DIAGNOSIS — N186 End stage renal disease: Secondary | ICD-10-CM | POA: Diagnosis not present

## 2017-04-23 DIAGNOSIS — E1129 Type 2 diabetes mellitus with other diabetic kidney complication: Secondary | ICD-10-CM | POA: Diagnosis not present

## 2017-04-23 DIAGNOSIS — N2581 Secondary hyperparathyroidism of renal origin: Secondary | ICD-10-CM | POA: Diagnosis not present

## 2017-04-25 DIAGNOSIS — E1129 Type 2 diabetes mellitus with other diabetic kidney complication: Secondary | ICD-10-CM | POA: Diagnosis not present

## 2017-04-25 DIAGNOSIS — N2581 Secondary hyperparathyroidism of renal origin: Secondary | ICD-10-CM | POA: Diagnosis not present

## 2017-04-25 DIAGNOSIS — N186 End stage renal disease: Secondary | ICD-10-CM | POA: Diagnosis not present

## 2017-04-28 DIAGNOSIS — N186 End stage renal disease: Secondary | ICD-10-CM | POA: Diagnosis not present

## 2017-04-30 DIAGNOSIS — N2581 Secondary hyperparathyroidism of renal origin: Secondary | ICD-10-CM | POA: Diagnosis not present

## 2017-04-30 DIAGNOSIS — N186 End stage renal disease: Secondary | ICD-10-CM | POA: Diagnosis not present

## 2017-04-30 DIAGNOSIS — E1129 Type 2 diabetes mellitus with other diabetic kidney complication: Secondary | ICD-10-CM | POA: Diagnosis not present

## 2017-05-02 DIAGNOSIS — N2581 Secondary hyperparathyroidism of renal origin: Secondary | ICD-10-CM | POA: Diagnosis not present

## 2017-05-02 DIAGNOSIS — N186 End stage renal disease: Secondary | ICD-10-CM | POA: Diagnosis not present

## 2017-05-02 DIAGNOSIS — E1129 Type 2 diabetes mellitus with other diabetic kidney complication: Secondary | ICD-10-CM | POA: Diagnosis not present

## 2017-05-05 DIAGNOSIS — N186 End stage renal disease: Secondary | ICD-10-CM | POA: Diagnosis not present

## 2017-05-05 DIAGNOSIS — N2581 Secondary hyperparathyroidism of renal origin: Secondary | ICD-10-CM | POA: Diagnosis not present

## 2017-05-05 DIAGNOSIS — E1129 Type 2 diabetes mellitus with other diabetic kidney complication: Secondary | ICD-10-CM | POA: Diagnosis not present

## 2017-05-06 DIAGNOSIS — E113313 Type 2 diabetes mellitus with moderate nonproliferative diabetic retinopathy with macular edema, bilateral: Secondary | ICD-10-CM | POA: Diagnosis not present

## 2017-05-06 DIAGNOSIS — H25813 Combined forms of age-related cataract, bilateral: Secondary | ICD-10-CM | POA: Diagnosis not present

## 2017-05-06 DIAGNOSIS — H40033 Anatomical narrow angle, bilateral: Secondary | ICD-10-CM | POA: Diagnosis not present

## 2017-05-07 DIAGNOSIS — N2581 Secondary hyperparathyroidism of renal origin: Secondary | ICD-10-CM | POA: Diagnosis not present

## 2017-05-07 DIAGNOSIS — N186 End stage renal disease: Secondary | ICD-10-CM | POA: Diagnosis not present

## 2017-05-07 DIAGNOSIS — E1129 Type 2 diabetes mellitus with other diabetic kidney complication: Secondary | ICD-10-CM | POA: Diagnosis not present

## 2017-05-08 DIAGNOSIS — Z780 Asymptomatic menopausal state: Secondary | ICD-10-CM | POA: Diagnosis not present

## 2017-05-08 DIAGNOSIS — M8589 Other specified disorders of bone density and structure, multiple sites: Secondary | ICD-10-CM | POA: Diagnosis not present

## 2017-05-09 DIAGNOSIS — N186 End stage renal disease: Secondary | ICD-10-CM | POA: Diagnosis not present

## 2017-05-09 DIAGNOSIS — E1129 Type 2 diabetes mellitus with other diabetic kidney complication: Secondary | ICD-10-CM | POA: Diagnosis not present

## 2017-05-09 DIAGNOSIS — N2581 Secondary hyperparathyroidism of renal origin: Secondary | ICD-10-CM | POA: Diagnosis not present

## 2017-05-14 DIAGNOSIS — N186 End stage renal disease: Secondary | ICD-10-CM | POA: Diagnosis not present

## 2017-05-14 DIAGNOSIS — E1129 Type 2 diabetes mellitus with other diabetic kidney complication: Secondary | ICD-10-CM | POA: Diagnosis not present

## 2017-05-14 DIAGNOSIS — N2581 Secondary hyperparathyroidism of renal origin: Secondary | ICD-10-CM | POA: Diagnosis not present

## 2017-05-15 DIAGNOSIS — N186 End stage renal disease: Secondary | ICD-10-CM | POA: Diagnosis not present

## 2017-05-15 DIAGNOSIS — I158 Other secondary hypertension: Secondary | ICD-10-CM | POA: Diagnosis not present

## 2017-05-15 DIAGNOSIS — E8779 Other fluid overload: Secondary | ICD-10-CM | POA: Diagnosis not present

## 2017-05-15 DIAGNOSIS — Z992 Dependence on renal dialysis: Secondary | ICD-10-CM | POA: Diagnosis not present

## 2017-05-15 DIAGNOSIS — N2581 Secondary hyperparathyroidism of renal origin: Secondary | ICD-10-CM | POA: Diagnosis not present

## 2017-05-16 DIAGNOSIS — N186 End stage renal disease: Secondary | ICD-10-CM | POA: Diagnosis not present

## 2017-05-16 DIAGNOSIS — E1129 Type 2 diabetes mellitus with other diabetic kidney complication: Secondary | ICD-10-CM | POA: Diagnosis not present

## 2017-05-16 DIAGNOSIS — N2581 Secondary hyperparathyroidism of renal origin: Secondary | ICD-10-CM | POA: Diagnosis not present

## 2017-05-16 DIAGNOSIS — D631 Anemia in chronic kidney disease: Secondary | ICD-10-CM | POA: Diagnosis not present

## 2017-05-19 DIAGNOSIS — N186 End stage renal disease: Secondary | ICD-10-CM | POA: Diagnosis not present

## 2017-05-19 DIAGNOSIS — D631 Anemia in chronic kidney disease: Secondary | ICD-10-CM | POA: Diagnosis not present

## 2017-05-19 DIAGNOSIS — E1129 Type 2 diabetes mellitus with other diabetic kidney complication: Secondary | ICD-10-CM | POA: Diagnosis not present

## 2017-05-19 DIAGNOSIS — N2581 Secondary hyperparathyroidism of renal origin: Secondary | ICD-10-CM | POA: Diagnosis not present

## 2017-05-21 DIAGNOSIS — D631 Anemia in chronic kidney disease: Secondary | ICD-10-CM | POA: Diagnosis not present

## 2017-05-21 DIAGNOSIS — N2581 Secondary hyperparathyroidism of renal origin: Secondary | ICD-10-CM | POA: Diagnosis not present

## 2017-05-21 DIAGNOSIS — E1129 Type 2 diabetes mellitus with other diabetic kidney complication: Secondary | ICD-10-CM | POA: Diagnosis not present

## 2017-05-21 DIAGNOSIS — N186 End stage renal disease: Secondary | ICD-10-CM | POA: Diagnosis not present

## 2017-05-22 DIAGNOSIS — E113311 Type 2 diabetes mellitus with moderate nonproliferative diabetic retinopathy with macular edema, right eye: Secondary | ICD-10-CM | POA: Diagnosis not present

## 2017-05-22 DIAGNOSIS — H35033 Hypertensive retinopathy, bilateral: Secondary | ICD-10-CM | POA: Diagnosis not present

## 2017-05-22 DIAGNOSIS — E113392 Type 2 diabetes mellitus with moderate nonproliferative diabetic retinopathy without macular edema, left eye: Secondary | ICD-10-CM | POA: Diagnosis not present

## 2017-05-23 DIAGNOSIS — D631 Anemia in chronic kidney disease: Secondary | ICD-10-CM | POA: Diagnosis not present

## 2017-05-23 DIAGNOSIS — E1129 Type 2 diabetes mellitus with other diabetic kidney complication: Secondary | ICD-10-CM | POA: Diagnosis not present

## 2017-05-23 DIAGNOSIS — N2581 Secondary hyperparathyroidism of renal origin: Secondary | ICD-10-CM | POA: Diagnosis not present

## 2017-05-23 DIAGNOSIS — N186 End stage renal disease: Secondary | ICD-10-CM | POA: Diagnosis not present

## 2017-05-26 DIAGNOSIS — E1129 Type 2 diabetes mellitus with other diabetic kidney complication: Secondary | ICD-10-CM | POA: Diagnosis not present

## 2017-05-26 DIAGNOSIS — N186 End stage renal disease: Secondary | ICD-10-CM | POA: Diagnosis not present

## 2017-05-26 DIAGNOSIS — D631 Anemia in chronic kidney disease: Secondary | ICD-10-CM | POA: Diagnosis not present

## 2017-05-26 DIAGNOSIS — N2581 Secondary hyperparathyroidism of renal origin: Secondary | ICD-10-CM | POA: Diagnosis not present

## 2017-05-28 DIAGNOSIS — N186 End stage renal disease: Secondary | ICD-10-CM | POA: Diagnosis not present

## 2017-05-28 DIAGNOSIS — E1129 Type 2 diabetes mellitus with other diabetic kidney complication: Secondary | ICD-10-CM | POA: Diagnosis not present

## 2017-05-28 DIAGNOSIS — N2581 Secondary hyperparathyroidism of renal origin: Secondary | ICD-10-CM | POA: Diagnosis not present

## 2017-05-28 DIAGNOSIS — D631 Anemia in chronic kidney disease: Secondary | ICD-10-CM | POA: Diagnosis not present

## 2017-05-30 DIAGNOSIS — D631 Anemia in chronic kidney disease: Secondary | ICD-10-CM | POA: Diagnosis not present

## 2017-05-30 DIAGNOSIS — E1129 Type 2 diabetes mellitus with other diabetic kidney complication: Secondary | ICD-10-CM | POA: Diagnosis not present

## 2017-05-30 DIAGNOSIS — N186 End stage renal disease: Secondary | ICD-10-CM | POA: Diagnosis not present

## 2017-05-30 DIAGNOSIS — N2581 Secondary hyperparathyroidism of renal origin: Secondary | ICD-10-CM | POA: Diagnosis not present

## 2017-06-02 DIAGNOSIS — E1129 Type 2 diabetes mellitus with other diabetic kidney complication: Secondary | ICD-10-CM | POA: Diagnosis not present

## 2017-06-02 DIAGNOSIS — D631 Anemia in chronic kidney disease: Secondary | ICD-10-CM | POA: Diagnosis not present

## 2017-06-02 DIAGNOSIS — N2581 Secondary hyperparathyroidism of renal origin: Secondary | ICD-10-CM | POA: Diagnosis not present

## 2017-06-02 DIAGNOSIS — N186 End stage renal disease: Secondary | ICD-10-CM | POA: Diagnosis not present

## 2017-06-04 DIAGNOSIS — D631 Anemia in chronic kidney disease: Secondary | ICD-10-CM | POA: Diagnosis not present

## 2017-06-04 DIAGNOSIS — E1129 Type 2 diabetes mellitus with other diabetic kidney complication: Secondary | ICD-10-CM | POA: Diagnosis not present

## 2017-06-04 DIAGNOSIS — N186 End stage renal disease: Secondary | ICD-10-CM | POA: Diagnosis not present

## 2017-06-04 DIAGNOSIS — N2581 Secondary hyperparathyroidism of renal origin: Secondary | ICD-10-CM | POA: Diagnosis not present

## 2017-06-06 DIAGNOSIS — N186 End stage renal disease: Secondary | ICD-10-CM | POA: Diagnosis not present

## 2017-06-06 DIAGNOSIS — E1129 Type 2 diabetes mellitus with other diabetic kidney complication: Secondary | ICD-10-CM | POA: Diagnosis not present

## 2017-06-06 DIAGNOSIS — D631 Anemia in chronic kidney disease: Secondary | ICD-10-CM | POA: Diagnosis not present

## 2017-06-06 DIAGNOSIS — N2581 Secondary hyperparathyroidism of renal origin: Secondary | ICD-10-CM | POA: Diagnosis not present

## 2017-06-09 DIAGNOSIS — N2581 Secondary hyperparathyroidism of renal origin: Secondary | ICD-10-CM | POA: Diagnosis not present

## 2017-06-09 DIAGNOSIS — D631 Anemia in chronic kidney disease: Secondary | ICD-10-CM | POA: Diagnosis not present

## 2017-06-09 DIAGNOSIS — E1129 Type 2 diabetes mellitus with other diabetic kidney complication: Secondary | ICD-10-CM | POA: Diagnosis not present

## 2017-06-09 DIAGNOSIS — N186 End stage renal disease: Secondary | ICD-10-CM | POA: Diagnosis not present

## 2017-06-10 DIAGNOSIS — T82868A Thrombosis of vascular prosthetic devices, implants and grafts, initial encounter: Secondary | ICD-10-CM | POA: Diagnosis not present

## 2017-06-10 DIAGNOSIS — Z992 Dependence on renal dialysis: Secondary | ICD-10-CM | POA: Diagnosis not present

## 2017-06-10 DIAGNOSIS — N186 End stage renal disease: Secondary | ICD-10-CM | POA: Diagnosis not present

## 2017-06-10 DIAGNOSIS — I871 Compression of vein: Secondary | ICD-10-CM | POA: Diagnosis not present

## 2017-06-11 DIAGNOSIS — E1129 Type 2 diabetes mellitus with other diabetic kidney complication: Secondary | ICD-10-CM | POA: Diagnosis not present

## 2017-06-11 DIAGNOSIS — D631 Anemia in chronic kidney disease: Secondary | ICD-10-CM | POA: Diagnosis not present

## 2017-06-11 DIAGNOSIS — N2581 Secondary hyperparathyroidism of renal origin: Secondary | ICD-10-CM | POA: Diagnosis not present

## 2017-06-11 DIAGNOSIS — N186 End stage renal disease: Secondary | ICD-10-CM | POA: Diagnosis not present

## 2017-06-12 DIAGNOSIS — E113311 Type 2 diabetes mellitus with moderate nonproliferative diabetic retinopathy with macular edema, right eye: Secondary | ICD-10-CM | POA: Diagnosis not present

## 2017-06-13 DIAGNOSIS — N2581 Secondary hyperparathyroidism of renal origin: Secondary | ICD-10-CM | POA: Diagnosis not present

## 2017-06-13 DIAGNOSIS — E1129 Type 2 diabetes mellitus with other diabetic kidney complication: Secondary | ICD-10-CM | POA: Diagnosis not present

## 2017-06-13 DIAGNOSIS — N186 End stage renal disease: Secondary | ICD-10-CM | POA: Diagnosis not present

## 2017-06-13 DIAGNOSIS — D631 Anemia in chronic kidney disease: Secondary | ICD-10-CM | POA: Diagnosis not present

## 2017-06-14 DIAGNOSIS — Z992 Dependence on renal dialysis: Secondary | ICD-10-CM | POA: Diagnosis not present

## 2017-06-14 DIAGNOSIS — I158 Other secondary hypertension: Secondary | ICD-10-CM | POA: Diagnosis not present

## 2017-06-14 DIAGNOSIS — N186 End stage renal disease: Secondary | ICD-10-CM | POA: Diagnosis not present

## 2017-06-16 DIAGNOSIS — D631 Anemia in chronic kidney disease: Secondary | ICD-10-CM | POA: Diagnosis not present

## 2017-06-16 DIAGNOSIS — R6883 Chills (without fever): Secondary | ICD-10-CM | POA: Diagnosis not present

## 2017-06-16 DIAGNOSIS — D509 Iron deficiency anemia, unspecified: Secondary | ICD-10-CM | POA: Diagnosis not present

## 2017-06-16 DIAGNOSIS — E1129 Type 2 diabetes mellitus with other diabetic kidney complication: Secondary | ICD-10-CM | POA: Diagnosis not present

## 2017-06-16 DIAGNOSIS — N2581 Secondary hyperparathyroidism of renal origin: Secondary | ICD-10-CM | POA: Diagnosis not present

## 2017-06-16 DIAGNOSIS — N186 End stage renal disease: Secondary | ICD-10-CM | POA: Diagnosis not present

## 2017-06-18 DIAGNOSIS — D631 Anemia in chronic kidney disease: Secondary | ICD-10-CM | POA: Diagnosis not present

## 2017-06-18 DIAGNOSIS — E1129 Type 2 diabetes mellitus with other diabetic kidney complication: Secondary | ICD-10-CM | POA: Diagnosis not present

## 2017-06-18 DIAGNOSIS — N2581 Secondary hyperparathyroidism of renal origin: Secondary | ICD-10-CM | POA: Diagnosis not present

## 2017-06-18 DIAGNOSIS — N186 End stage renal disease: Secondary | ICD-10-CM | POA: Diagnosis not present

## 2017-06-18 DIAGNOSIS — R6883 Chills (without fever): Secondary | ICD-10-CM | POA: Diagnosis not present

## 2017-06-18 DIAGNOSIS — D509 Iron deficiency anemia, unspecified: Secondary | ICD-10-CM | POA: Diagnosis not present

## 2017-06-20 DIAGNOSIS — N2581 Secondary hyperparathyroidism of renal origin: Secondary | ICD-10-CM | POA: Diagnosis not present

## 2017-06-20 DIAGNOSIS — D509 Iron deficiency anemia, unspecified: Secondary | ICD-10-CM | POA: Diagnosis not present

## 2017-06-20 DIAGNOSIS — N186 End stage renal disease: Secondary | ICD-10-CM | POA: Diagnosis not present

## 2017-06-20 DIAGNOSIS — R6883 Chills (without fever): Secondary | ICD-10-CM | POA: Diagnosis not present

## 2017-06-20 DIAGNOSIS — D631 Anemia in chronic kidney disease: Secondary | ICD-10-CM | POA: Diagnosis not present

## 2017-06-20 DIAGNOSIS — E1129 Type 2 diabetes mellitus with other diabetic kidney complication: Secondary | ICD-10-CM | POA: Diagnosis not present

## 2017-06-22 DIAGNOSIS — J449 Chronic obstructive pulmonary disease, unspecified: Secondary | ICD-10-CM | POA: Diagnosis not present

## 2017-06-22 DIAGNOSIS — M545 Low back pain: Secondary | ICD-10-CM | POA: Diagnosis not present

## 2017-06-22 DIAGNOSIS — K219 Gastro-esophageal reflux disease without esophagitis: Secondary | ICD-10-CM | POA: Diagnosis not present

## 2017-06-22 DIAGNOSIS — M1990 Unspecified osteoarthritis, unspecified site: Secondary | ICD-10-CM | POA: Diagnosis not present

## 2017-06-22 DIAGNOSIS — Z8673 Personal history of transient ischemic attack (TIA), and cerebral infarction without residual deficits: Secondary | ICD-10-CM | POA: Diagnosis not present

## 2017-06-22 DIAGNOSIS — Z992 Dependence on renal dialysis: Secondary | ICD-10-CM | POA: Diagnosis not present

## 2017-06-22 DIAGNOSIS — Z794 Long term (current) use of insulin: Secondary | ICD-10-CM | POA: Diagnosis not present

## 2017-06-22 DIAGNOSIS — Z7901 Long term (current) use of anticoagulants: Secondary | ICD-10-CM | POA: Diagnosis not present

## 2017-06-22 DIAGNOSIS — E1122 Type 2 diabetes mellitus with diabetic chronic kidney disease: Secondary | ICD-10-CM | POA: Diagnosis not present

## 2017-06-22 DIAGNOSIS — N186 End stage renal disease: Secondary | ICD-10-CM | POA: Diagnosis not present

## 2017-06-22 DIAGNOSIS — I132 Hypertensive heart and chronic kidney disease with heart failure and with stage 5 chronic kidney disease, or end stage renal disease: Secondary | ICD-10-CM | POA: Diagnosis not present

## 2017-06-22 DIAGNOSIS — I509 Heart failure, unspecified: Secondary | ICD-10-CM | POA: Diagnosis not present

## 2017-06-23 DIAGNOSIS — E1129 Type 2 diabetes mellitus with other diabetic kidney complication: Secondary | ICD-10-CM | POA: Diagnosis not present

## 2017-06-23 DIAGNOSIS — R6883 Chills (without fever): Secondary | ICD-10-CM | POA: Diagnosis not present

## 2017-06-23 DIAGNOSIS — N186 End stage renal disease: Secondary | ICD-10-CM | POA: Diagnosis not present

## 2017-06-23 DIAGNOSIS — N2581 Secondary hyperparathyroidism of renal origin: Secondary | ICD-10-CM | POA: Diagnosis not present

## 2017-06-23 DIAGNOSIS — D631 Anemia in chronic kidney disease: Secondary | ICD-10-CM | POA: Diagnosis not present

## 2017-06-23 DIAGNOSIS — D509 Iron deficiency anemia, unspecified: Secondary | ICD-10-CM | POA: Diagnosis not present

## 2017-06-24 DIAGNOSIS — I132 Hypertensive heart and chronic kidney disease with heart failure and with stage 5 chronic kidney disease, or end stage renal disease: Secondary | ICD-10-CM | POA: Diagnosis not present

## 2017-06-24 DIAGNOSIS — E0822 Diabetes mellitus due to underlying condition with diabetic chronic kidney disease: Secondary | ICD-10-CM | POA: Diagnosis not present

## 2017-06-24 DIAGNOSIS — N186 End stage renal disease: Secondary | ICD-10-CM | POA: Diagnosis not present

## 2017-06-24 DIAGNOSIS — J449 Chronic obstructive pulmonary disease, unspecified: Secondary | ICD-10-CM | POA: Diagnosis not present

## 2017-06-24 DIAGNOSIS — Z992 Dependence on renal dialysis: Secondary | ICD-10-CM | POA: Diagnosis not present

## 2017-06-24 DIAGNOSIS — E1122 Type 2 diabetes mellitus with diabetic chronic kidney disease: Secondary | ICD-10-CM | POA: Diagnosis not present

## 2017-06-24 DIAGNOSIS — M545 Low back pain: Secondary | ICD-10-CM | POA: Diagnosis not present

## 2017-06-24 DIAGNOSIS — K210 Gastro-esophageal reflux disease with esophagitis: Secondary | ICD-10-CM | POA: Diagnosis not present

## 2017-06-24 DIAGNOSIS — J441 Chronic obstructive pulmonary disease with (acute) exacerbation: Secondary | ICD-10-CM | POA: Diagnosis not present

## 2017-06-24 DIAGNOSIS — I509 Heart failure, unspecified: Secondary | ICD-10-CM | POA: Diagnosis not present

## 2017-06-24 DIAGNOSIS — I120 Hypertensive chronic kidney disease with stage 5 chronic kidney disease or end stage renal disease: Secondary | ICD-10-CM | POA: Diagnosis not present

## 2017-06-25 DIAGNOSIS — R6883 Chills (without fever): Secondary | ICD-10-CM | POA: Diagnosis not present

## 2017-06-25 DIAGNOSIS — D631 Anemia in chronic kidney disease: Secondary | ICD-10-CM | POA: Diagnosis not present

## 2017-06-25 DIAGNOSIS — E1129 Type 2 diabetes mellitus with other diabetic kidney complication: Secondary | ICD-10-CM | POA: Diagnosis not present

## 2017-06-25 DIAGNOSIS — D509 Iron deficiency anemia, unspecified: Secondary | ICD-10-CM | POA: Diagnosis not present

## 2017-06-25 DIAGNOSIS — N186 End stage renal disease: Secondary | ICD-10-CM | POA: Diagnosis not present

## 2017-06-25 DIAGNOSIS — N2581 Secondary hyperparathyroidism of renal origin: Secondary | ICD-10-CM | POA: Diagnosis not present

## 2017-06-27 DIAGNOSIS — E1129 Type 2 diabetes mellitus with other diabetic kidney complication: Secondary | ICD-10-CM | POA: Diagnosis not present

## 2017-06-27 DIAGNOSIS — N2581 Secondary hyperparathyroidism of renal origin: Secondary | ICD-10-CM | POA: Diagnosis not present

## 2017-06-27 DIAGNOSIS — D509 Iron deficiency anemia, unspecified: Secondary | ICD-10-CM | POA: Diagnosis not present

## 2017-06-27 DIAGNOSIS — R6883 Chills (without fever): Secondary | ICD-10-CM | POA: Diagnosis not present

## 2017-06-27 DIAGNOSIS — N186 End stage renal disease: Secondary | ICD-10-CM | POA: Diagnosis not present

## 2017-06-27 DIAGNOSIS — D631 Anemia in chronic kidney disease: Secondary | ICD-10-CM | POA: Diagnosis not present

## 2017-06-30 DIAGNOSIS — R6883 Chills (without fever): Secondary | ICD-10-CM | POA: Diagnosis not present

## 2017-06-30 DIAGNOSIS — D631 Anemia in chronic kidney disease: Secondary | ICD-10-CM | POA: Diagnosis not present

## 2017-06-30 DIAGNOSIS — D509 Iron deficiency anemia, unspecified: Secondary | ICD-10-CM | POA: Diagnosis not present

## 2017-06-30 DIAGNOSIS — N186 End stage renal disease: Secondary | ICD-10-CM | POA: Diagnosis not present

## 2017-06-30 DIAGNOSIS — E1129 Type 2 diabetes mellitus with other diabetic kidney complication: Secondary | ICD-10-CM | POA: Diagnosis not present

## 2017-06-30 DIAGNOSIS — N2581 Secondary hyperparathyroidism of renal origin: Secondary | ICD-10-CM | POA: Diagnosis not present

## 2017-07-02 DIAGNOSIS — D631 Anemia in chronic kidney disease: Secondary | ICD-10-CM | POA: Diagnosis not present

## 2017-07-02 DIAGNOSIS — D509 Iron deficiency anemia, unspecified: Secondary | ICD-10-CM | POA: Diagnosis not present

## 2017-07-02 DIAGNOSIS — R6883 Chills (without fever): Secondary | ICD-10-CM | POA: Diagnosis not present

## 2017-07-02 DIAGNOSIS — J449 Chronic obstructive pulmonary disease, unspecified: Secondary | ICD-10-CM | POA: Diagnosis not present

## 2017-07-02 DIAGNOSIS — N2581 Secondary hyperparathyroidism of renal origin: Secondary | ICD-10-CM | POA: Diagnosis not present

## 2017-07-02 DIAGNOSIS — I132 Hypertensive heart and chronic kidney disease with heart failure and with stage 5 chronic kidney disease, or end stage renal disease: Secondary | ICD-10-CM | POA: Diagnosis not present

## 2017-07-02 DIAGNOSIS — R5383 Other fatigue: Secondary | ICD-10-CM | POA: Diagnosis not present

## 2017-07-02 DIAGNOSIS — E1129 Type 2 diabetes mellitus with other diabetic kidney complication: Secondary | ICD-10-CM | POA: Diagnosis not present

## 2017-07-02 DIAGNOSIS — M545 Low back pain: Secondary | ICD-10-CM | POA: Diagnosis not present

## 2017-07-02 DIAGNOSIS — E1122 Type 2 diabetes mellitus with diabetic chronic kidney disease: Secondary | ICD-10-CM | POA: Diagnosis not present

## 2017-07-02 DIAGNOSIS — N186 End stage renal disease: Secondary | ICD-10-CM | POA: Diagnosis not present

## 2017-07-02 DIAGNOSIS — I509 Heart failure, unspecified: Secondary | ICD-10-CM | POA: Diagnosis not present

## 2017-07-03 DIAGNOSIS — I132 Hypertensive heart and chronic kidney disease with heart failure and with stage 5 chronic kidney disease, or end stage renal disease: Secondary | ICD-10-CM | POA: Diagnosis not present

## 2017-07-03 DIAGNOSIS — E1122 Type 2 diabetes mellitus with diabetic chronic kidney disease: Secondary | ICD-10-CM | POA: Diagnosis not present

## 2017-07-03 DIAGNOSIS — N186 End stage renal disease: Secondary | ICD-10-CM | POA: Diagnosis not present

## 2017-07-03 DIAGNOSIS — J449 Chronic obstructive pulmonary disease, unspecified: Secondary | ICD-10-CM | POA: Diagnosis not present

## 2017-07-03 DIAGNOSIS — I509 Heart failure, unspecified: Secondary | ICD-10-CM | POA: Diagnosis not present

## 2017-07-03 DIAGNOSIS — M545 Low back pain: Secondary | ICD-10-CM | POA: Diagnosis not present

## 2017-07-04 DIAGNOSIS — N186 End stage renal disease: Secondary | ICD-10-CM | POA: Diagnosis not present

## 2017-07-04 DIAGNOSIS — N2581 Secondary hyperparathyroidism of renal origin: Secondary | ICD-10-CM | POA: Diagnosis not present

## 2017-07-04 DIAGNOSIS — R6883 Chills (without fever): Secondary | ICD-10-CM | POA: Diagnosis not present

## 2017-07-04 DIAGNOSIS — D509 Iron deficiency anemia, unspecified: Secondary | ICD-10-CM | POA: Diagnosis not present

## 2017-07-04 DIAGNOSIS — E1129 Type 2 diabetes mellitus with other diabetic kidney complication: Secondary | ICD-10-CM | POA: Diagnosis not present

## 2017-07-04 DIAGNOSIS — D631 Anemia in chronic kidney disease: Secondary | ICD-10-CM | POA: Diagnosis not present

## 2017-07-07 DIAGNOSIS — R6883 Chills (without fever): Secondary | ICD-10-CM | POA: Diagnosis not present

## 2017-07-07 DIAGNOSIS — E1129 Type 2 diabetes mellitus with other diabetic kidney complication: Secondary | ICD-10-CM | POA: Diagnosis not present

## 2017-07-07 DIAGNOSIS — D509 Iron deficiency anemia, unspecified: Secondary | ICD-10-CM | POA: Diagnosis not present

## 2017-07-07 DIAGNOSIS — D631 Anemia in chronic kidney disease: Secondary | ICD-10-CM | POA: Diagnosis not present

## 2017-07-07 DIAGNOSIS — N186 End stage renal disease: Secondary | ICD-10-CM | POA: Diagnosis not present

## 2017-07-07 DIAGNOSIS — N2581 Secondary hyperparathyroidism of renal origin: Secondary | ICD-10-CM | POA: Diagnosis not present

## 2017-07-08 ENCOUNTER — Ambulatory Visit: Payer: Medicare Other | Admitting: Podiatry

## 2017-07-09 DIAGNOSIS — Z880 Allergy status to penicillin: Secondary | ICD-10-CM | POA: Diagnosis not present

## 2017-07-09 DIAGNOSIS — M5442 Lumbago with sciatica, left side: Secondary | ICD-10-CM | POA: Diagnosis not present

## 2017-07-09 DIAGNOSIS — N2581 Secondary hyperparathyroidism of renal origin: Secondary | ICD-10-CM | POA: Diagnosis not present

## 2017-07-09 DIAGNOSIS — E1129 Type 2 diabetes mellitus with other diabetic kidney complication: Secondary | ICD-10-CM | POA: Diagnosis not present

## 2017-07-09 DIAGNOSIS — Z992 Dependence on renal dialysis: Secondary | ICD-10-CM | POA: Diagnosis not present

## 2017-07-09 DIAGNOSIS — R109 Unspecified abdominal pain: Secondary | ICD-10-CM | POA: Diagnosis not present

## 2017-07-09 DIAGNOSIS — G8929 Other chronic pain: Secondary | ICD-10-CM | POA: Diagnosis not present

## 2017-07-09 DIAGNOSIS — J45909 Unspecified asthma, uncomplicated: Secondary | ICD-10-CM | POA: Diagnosis not present

## 2017-07-09 DIAGNOSIS — N186 End stage renal disease: Secondary | ICD-10-CM | POA: Diagnosis not present

## 2017-07-09 DIAGNOSIS — I110 Hypertensive heart disease with heart failure: Secondary | ICD-10-CM | POA: Diagnosis not present

## 2017-07-09 DIAGNOSIS — R1084 Generalized abdominal pain: Secondary | ICD-10-CM | POA: Diagnosis not present

## 2017-07-09 DIAGNOSIS — D631 Anemia in chronic kidney disease: Secondary | ICD-10-CM | POA: Diagnosis not present

## 2017-07-09 DIAGNOSIS — M5432 Sciatica, left side: Secondary | ICD-10-CM | POA: Diagnosis not present

## 2017-07-09 DIAGNOSIS — Z8673 Personal history of transient ischemic attack (TIA), and cerebral infarction without residual deficits: Secondary | ICD-10-CM | POA: Diagnosis not present

## 2017-07-09 DIAGNOSIS — D509 Iron deficiency anemia, unspecified: Secondary | ICD-10-CM | POA: Diagnosis not present

## 2017-07-09 DIAGNOSIS — I509 Heart failure, unspecified: Secondary | ICD-10-CM | POA: Diagnosis not present

## 2017-07-09 DIAGNOSIS — R6883 Chills (without fever): Secondary | ICD-10-CM | POA: Diagnosis not present

## 2017-07-09 DIAGNOSIS — R5383 Other fatigue: Secondary | ICD-10-CM | POA: Diagnosis not present

## 2017-07-10 DIAGNOSIS — J449 Chronic obstructive pulmonary disease, unspecified: Secondary | ICD-10-CM | POA: Diagnosis not present

## 2017-07-10 DIAGNOSIS — H2513 Age-related nuclear cataract, bilateral: Secondary | ICD-10-CM | POA: Diagnosis not present

## 2017-07-10 DIAGNOSIS — I132 Hypertensive heart and chronic kidney disease with heart failure and with stage 5 chronic kidney disease, or end stage renal disease: Secondary | ICD-10-CM | POA: Diagnosis not present

## 2017-07-10 DIAGNOSIS — E113392 Type 2 diabetes mellitus with moderate nonproliferative diabetic retinopathy without macular edema, left eye: Secondary | ICD-10-CM | POA: Diagnosis not present

## 2017-07-10 DIAGNOSIS — M545 Low back pain: Secondary | ICD-10-CM | POA: Diagnosis not present

## 2017-07-10 DIAGNOSIS — E1122 Type 2 diabetes mellitus with diabetic chronic kidney disease: Secondary | ICD-10-CM | POA: Diagnosis not present

## 2017-07-10 DIAGNOSIS — N186 End stage renal disease: Secondary | ICD-10-CM | POA: Diagnosis not present

## 2017-07-10 DIAGNOSIS — I509 Heart failure, unspecified: Secondary | ICD-10-CM | POA: Diagnosis not present

## 2017-07-10 DIAGNOSIS — E113311 Type 2 diabetes mellitus with moderate nonproliferative diabetic retinopathy with macular edema, right eye: Secondary | ICD-10-CM | POA: Diagnosis not present

## 2017-07-10 DIAGNOSIS — H35033 Hypertensive retinopathy, bilateral: Secondary | ICD-10-CM | POA: Diagnosis not present

## 2017-07-11 DIAGNOSIS — N2581 Secondary hyperparathyroidism of renal origin: Secondary | ICD-10-CM | POA: Diagnosis not present

## 2017-07-11 DIAGNOSIS — R6883 Chills (without fever): Secondary | ICD-10-CM | POA: Diagnosis not present

## 2017-07-11 DIAGNOSIS — D631 Anemia in chronic kidney disease: Secondary | ICD-10-CM | POA: Diagnosis not present

## 2017-07-11 DIAGNOSIS — E1129 Type 2 diabetes mellitus with other diabetic kidney complication: Secondary | ICD-10-CM | POA: Diagnosis not present

## 2017-07-11 DIAGNOSIS — N186 End stage renal disease: Secondary | ICD-10-CM | POA: Diagnosis not present

## 2017-07-11 DIAGNOSIS — D509 Iron deficiency anemia, unspecified: Secondary | ICD-10-CM | POA: Diagnosis not present

## 2017-07-12 DIAGNOSIS — N186 End stage renal disease: Secondary | ICD-10-CM | POA: Diagnosis not present

## 2017-07-12 DIAGNOSIS — J449 Chronic obstructive pulmonary disease, unspecified: Secondary | ICD-10-CM | POA: Diagnosis not present

## 2017-07-12 DIAGNOSIS — I132 Hypertensive heart and chronic kidney disease with heart failure and with stage 5 chronic kidney disease, or end stage renal disease: Secondary | ICD-10-CM | POA: Diagnosis not present

## 2017-07-12 DIAGNOSIS — M545 Low back pain: Secondary | ICD-10-CM | POA: Diagnosis not present

## 2017-07-12 DIAGNOSIS — E1122 Type 2 diabetes mellitus with diabetic chronic kidney disease: Secondary | ICD-10-CM | POA: Diagnosis not present

## 2017-07-12 DIAGNOSIS — I509 Heart failure, unspecified: Secondary | ICD-10-CM | POA: Diagnosis not present

## 2017-07-14 DIAGNOSIS — D631 Anemia in chronic kidney disease: Secondary | ICD-10-CM | POA: Diagnosis not present

## 2017-07-14 DIAGNOSIS — R6883 Chills (without fever): Secondary | ICD-10-CM | POA: Diagnosis not present

## 2017-07-14 DIAGNOSIS — E1129 Type 2 diabetes mellitus with other diabetic kidney complication: Secondary | ICD-10-CM | POA: Diagnosis not present

## 2017-07-14 DIAGNOSIS — N186 End stage renal disease: Secondary | ICD-10-CM | POA: Diagnosis not present

## 2017-07-14 DIAGNOSIS — N2581 Secondary hyperparathyroidism of renal origin: Secondary | ICD-10-CM | POA: Diagnosis not present

## 2017-07-14 DIAGNOSIS — D509 Iron deficiency anemia, unspecified: Secondary | ICD-10-CM | POA: Diagnosis not present

## 2017-07-15 DIAGNOSIS — E1122 Type 2 diabetes mellitus with diabetic chronic kidney disease: Secondary | ICD-10-CM | POA: Diagnosis not present

## 2017-07-15 DIAGNOSIS — M545 Low back pain: Secondary | ICD-10-CM | POA: Diagnosis not present

## 2017-07-15 DIAGNOSIS — Z992 Dependence on renal dialysis: Secondary | ICD-10-CM | POA: Diagnosis not present

## 2017-07-15 DIAGNOSIS — N186 End stage renal disease: Secondary | ICD-10-CM | POA: Diagnosis not present

## 2017-07-15 DIAGNOSIS — I509 Heart failure, unspecified: Secondary | ICD-10-CM | POA: Diagnosis not present

## 2017-07-15 DIAGNOSIS — J449 Chronic obstructive pulmonary disease, unspecified: Secondary | ICD-10-CM | POA: Diagnosis not present

## 2017-07-15 DIAGNOSIS — I132 Hypertensive heart and chronic kidney disease with heart failure and with stage 5 chronic kidney disease, or end stage renal disease: Secondary | ICD-10-CM | POA: Diagnosis not present

## 2017-07-15 DIAGNOSIS — I158 Other secondary hypertension: Secondary | ICD-10-CM | POA: Diagnosis not present

## 2017-07-16 DIAGNOSIS — N2581 Secondary hyperparathyroidism of renal origin: Secondary | ICD-10-CM | POA: Diagnosis not present

## 2017-07-16 DIAGNOSIS — N186 End stage renal disease: Secondary | ICD-10-CM | POA: Diagnosis not present

## 2017-07-16 DIAGNOSIS — E875 Hyperkalemia: Secondary | ICD-10-CM | POA: Diagnosis not present

## 2017-07-16 DIAGNOSIS — D509 Iron deficiency anemia, unspecified: Secondary | ICD-10-CM | POA: Diagnosis not present

## 2017-07-16 DIAGNOSIS — E1129 Type 2 diabetes mellitus with other diabetic kidney complication: Secondary | ICD-10-CM | POA: Diagnosis not present

## 2017-07-16 DIAGNOSIS — D631 Anemia in chronic kidney disease: Secondary | ICD-10-CM | POA: Diagnosis not present

## 2017-07-17 DIAGNOSIS — J449 Chronic obstructive pulmonary disease, unspecified: Secondary | ICD-10-CM | POA: Diagnosis not present

## 2017-07-17 DIAGNOSIS — E1122 Type 2 diabetes mellitus with diabetic chronic kidney disease: Secondary | ICD-10-CM | POA: Diagnosis not present

## 2017-07-17 DIAGNOSIS — N186 End stage renal disease: Secondary | ICD-10-CM | POA: Diagnosis not present

## 2017-07-17 DIAGNOSIS — I132 Hypertensive heart and chronic kidney disease with heart failure and with stage 5 chronic kidney disease, or end stage renal disease: Secondary | ICD-10-CM | POA: Diagnosis not present

## 2017-07-17 DIAGNOSIS — I509 Heart failure, unspecified: Secondary | ICD-10-CM | POA: Diagnosis not present

## 2017-07-17 DIAGNOSIS — M545 Low back pain: Secondary | ICD-10-CM | POA: Diagnosis not present

## 2017-07-18 DIAGNOSIS — E1129 Type 2 diabetes mellitus with other diabetic kidney complication: Secondary | ICD-10-CM | POA: Diagnosis not present

## 2017-07-18 DIAGNOSIS — D631 Anemia in chronic kidney disease: Secondary | ICD-10-CM | POA: Diagnosis not present

## 2017-07-18 DIAGNOSIS — N186 End stage renal disease: Secondary | ICD-10-CM | POA: Diagnosis not present

## 2017-07-18 DIAGNOSIS — E875 Hyperkalemia: Secondary | ICD-10-CM | POA: Diagnosis not present

## 2017-07-18 DIAGNOSIS — N2581 Secondary hyperparathyroidism of renal origin: Secondary | ICD-10-CM | POA: Diagnosis not present

## 2017-07-18 DIAGNOSIS — D509 Iron deficiency anemia, unspecified: Secondary | ICD-10-CM | POA: Diagnosis not present

## 2017-07-21 DIAGNOSIS — N2581 Secondary hyperparathyroidism of renal origin: Secondary | ICD-10-CM | POA: Diagnosis not present

## 2017-07-21 DIAGNOSIS — D631 Anemia in chronic kidney disease: Secondary | ICD-10-CM | POA: Diagnosis not present

## 2017-07-21 DIAGNOSIS — E875 Hyperkalemia: Secondary | ICD-10-CM | POA: Diagnosis not present

## 2017-07-21 DIAGNOSIS — D509 Iron deficiency anemia, unspecified: Secondary | ICD-10-CM | POA: Diagnosis not present

## 2017-07-21 DIAGNOSIS — E1129 Type 2 diabetes mellitus with other diabetic kidney complication: Secondary | ICD-10-CM | POA: Diagnosis not present

## 2017-07-21 DIAGNOSIS — N186 End stage renal disease: Secondary | ICD-10-CM | POA: Diagnosis not present

## 2017-07-22 DIAGNOSIS — J449 Chronic obstructive pulmonary disease, unspecified: Secondary | ICD-10-CM | POA: Diagnosis not present

## 2017-07-22 DIAGNOSIS — I132 Hypertensive heart and chronic kidney disease with heart failure and with stage 5 chronic kidney disease, or end stage renal disease: Secondary | ICD-10-CM | POA: Diagnosis not present

## 2017-07-22 DIAGNOSIS — E1122 Type 2 diabetes mellitus with diabetic chronic kidney disease: Secondary | ICD-10-CM | POA: Diagnosis not present

## 2017-07-22 DIAGNOSIS — M545 Low back pain: Secondary | ICD-10-CM | POA: Diagnosis not present

## 2017-07-22 DIAGNOSIS — J441 Chronic obstructive pulmonary disease with (acute) exacerbation: Secondary | ICD-10-CM | POA: Diagnosis not present

## 2017-07-22 DIAGNOSIS — I129 Hypertensive chronic kidney disease with stage 1 through stage 4 chronic kidney disease, or unspecified chronic kidney disease: Secondary | ICD-10-CM | POA: Diagnosis not present

## 2017-07-22 DIAGNOSIS — N186 End stage renal disease: Secondary | ICD-10-CM | POA: Diagnosis not present

## 2017-07-22 DIAGNOSIS — Z992 Dependence on renal dialysis: Secondary | ICD-10-CM | POA: Diagnosis not present

## 2017-07-22 DIAGNOSIS — E118 Type 2 diabetes mellitus with unspecified complications: Secondary | ICD-10-CM | POA: Diagnosis not present

## 2017-07-22 DIAGNOSIS — I509 Heart failure, unspecified: Secondary | ICD-10-CM | POA: Diagnosis not present

## 2017-07-23 DIAGNOSIS — D509 Iron deficiency anemia, unspecified: Secondary | ICD-10-CM | POA: Diagnosis not present

## 2017-07-23 DIAGNOSIS — D631 Anemia in chronic kidney disease: Secondary | ICD-10-CM | POA: Diagnosis not present

## 2017-07-23 DIAGNOSIS — N186 End stage renal disease: Secondary | ICD-10-CM | POA: Diagnosis not present

## 2017-07-23 DIAGNOSIS — E875 Hyperkalemia: Secondary | ICD-10-CM | POA: Diagnosis not present

## 2017-07-23 DIAGNOSIS — E1129 Type 2 diabetes mellitus with other diabetic kidney complication: Secondary | ICD-10-CM | POA: Diagnosis not present

## 2017-07-23 DIAGNOSIS — N2581 Secondary hyperparathyroidism of renal origin: Secondary | ICD-10-CM | POA: Diagnosis not present

## 2017-07-24 DIAGNOSIS — M545 Low back pain: Secondary | ICD-10-CM | POA: Diagnosis not present

## 2017-07-24 DIAGNOSIS — I509 Heart failure, unspecified: Secondary | ICD-10-CM | POA: Diagnosis not present

## 2017-07-24 DIAGNOSIS — I132 Hypertensive heart and chronic kidney disease with heart failure and with stage 5 chronic kidney disease, or end stage renal disease: Secondary | ICD-10-CM | POA: Diagnosis not present

## 2017-07-24 DIAGNOSIS — N186 End stage renal disease: Secondary | ICD-10-CM | POA: Diagnosis not present

## 2017-07-24 DIAGNOSIS — E1122 Type 2 diabetes mellitus with diabetic chronic kidney disease: Secondary | ICD-10-CM | POA: Diagnosis not present

## 2017-07-24 DIAGNOSIS — J449 Chronic obstructive pulmonary disease, unspecified: Secondary | ICD-10-CM | POA: Diagnosis not present

## 2017-07-25 DIAGNOSIS — D631 Anemia in chronic kidney disease: Secondary | ICD-10-CM | POA: Diagnosis not present

## 2017-07-25 DIAGNOSIS — D509 Iron deficiency anemia, unspecified: Secondary | ICD-10-CM | POA: Diagnosis not present

## 2017-07-25 DIAGNOSIS — E1129 Type 2 diabetes mellitus with other diabetic kidney complication: Secondary | ICD-10-CM | POA: Diagnosis not present

## 2017-07-25 DIAGNOSIS — N186 End stage renal disease: Secondary | ICD-10-CM | POA: Diagnosis not present

## 2017-07-25 DIAGNOSIS — N2581 Secondary hyperparathyroidism of renal origin: Secondary | ICD-10-CM | POA: Diagnosis not present

## 2017-07-25 DIAGNOSIS — E875 Hyperkalemia: Secondary | ICD-10-CM | POA: Diagnosis not present

## 2017-07-28 DIAGNOSIS — E1129 Type 2 diabetes mellitus with other diabetic kidney complication: Secondary | ICD-10-CM | POA: Diagnosis not present

## 2017-07-28 DIAGNOSIS — N186 End stage renal disease: Secondary | ICD-10-CM | POA: Diagnosis not present

## 2017-07-28 DIAGNOSIS — D509 Iron deficiency anemia, unspecified: Secondary | ICD-10-CM | POA: Diagnosis not present

## 2017-07-28 DIAGNOSIS — E875 Hyperkalemia: Secondary | ICD-10-CM | POA: Diagnosis not present

## 2017-07-28 DIAGNOSIS — D631 Anemia in chronic kidney disease: Secondary | ICD-10-CM | POA: Diagnosis not present

## 2017-07-28 DIAGNOSIS — N2581 Secondary hyperparathyroidism of renal origin: Secondary | ICD-10-CM | POA: Diagnosis not present

## 2017-07-30 DIAGNOSIS — N2581 Secondary hyperparathyroidism of renal origin: Secondary | ICD-10-CM | POA: Diagnosis not present

## 2017-07-30 DIAGNOSIS — N186 End stage renal disease: Secondary | ICD-10-CM | POA: Diagnosis not present

## 2017-07-30 DIAGNOSIS — E875 Hyperkalemia: Secondary | ICD-10-CM | POA: Diagnosis not present

## 2017-07-30 DIAGNOSIS — D631 Anemia in chronic kidney disease: Secondary | ICD-10-CM | POA: Diagnosis not present

## 2017-07-30 DIAGNOSIS — D509 Iron deficiency anemia, unspecified: Secondary | ICD-10-CM | POA: Diagnosis not present

## 2017-07-30 DIAGNOSIS — E1129 Type 2 diabetes mellitus with other diabetic kidney complication: Secondary | ICD-10-CM | POA: Diagnosis not present

## 2017-07-31 DIAGNOSIS — E1122 Type 2 diabetes mellitus with diabetic chronic kidney disease: Secondary | ICD-10-CM | POA: Diagnosis not present

## 2017-07-31 DIAGNOSIS — I509 Heart failure, unspecified: Secondary | ICD-10-CM | POA: Diagnosis not present

## 2017-07-31 DIAGNOSIS — I132 Hypertensive heart and chronic kidney disease with heart failure and with stage 5 chronic kidney disease, or end stage renal disease: Secondary | ICD-10-CM | POA: Diagnosis not present

## 2017-07-31 DIAGNOSIS — M545 Low back pain: Secondary | ICD-10-CM | POA: Diagnosis not present

## 2017-07-31 DIAGNOSIS — N186 End stage renal disease: Secondary | ICD-10-CM | POA: Diagnosis not present

## 2017-07-31 DIAGNOSIS — J449 Chronic obstructive pulmonary disease, unspecified: Secondary | ICD-10-CM | POA: Diagnosis not present

## 2017-08-01 DIAGNOSIS — E875 Hyperkalemia: Secondary | ICD-10-CM | POA: Diagnosis not present

## 2017-08-01 DIAGNOSIS — D509 Iron deficiency anemia, unspecified: Secondary | ICD-10-CM | POA: Diagnosis not present

## 2017-08-01 DIAGNOSIS — E1129 Type 2 diabetes mellitus with other diabetic kidney complication: Secondary | ICD-10-CM | POA: Diagnosis not present

## 2017-08-01 DIAGNOSIS — D631 Anemia in chronic kidney disease: Secondary | ICD-10-CM | POA: Diagnosis not present

## 2017-08-01 DIAGNOSIS — R0902 Hypoxemia: Secondary | ICD-10-CM | POA: Diagnosis not present

## 2017-08-01 DIAGNOSIS — J449 Chronic obstructive pulmonary disease, unspecified: Secondary | ICD-10-CM | POA: Diagnosis not present

## 2017-08-01 DIAGNOSIS — N2581 Secondary hyperparathyroidism of renal origin: Secondary | ICD-10-CM | POA: Diagnosis not present

## 2017-08-01 DIAGNOSIS — N186 End stage renal disease: Secondary | ICD-10-CM | POA: Diagnosis not present

## 2017-08-04 DIAGNOSIS — E875 Hyperkalemia: Secondary | ICD-10-CM | POA: Diagnosis not present

## 2017-08-04 DIAGNOSIS — D631 Anemia in chronic kidney disease: Secondary | ICD-10-CM | POA: Diagnosis not present

## 2017-08-04 DIAGNOSIS — N2581 Secondary hyperparathyroidism of renal origin: Secondary | ICD-10-CM | POA: Diagnosis not present

## 2017-08-04 DIAGNOSIS — E1129 Type 2 diabetes mellitus with other diabetic kidney complication: Secondary | ICD-10-CM | POA: Diagnosis not present

## 2017-08-04 DIAGNOSIS — D509 Iron deficiency anemia, unspecified: Secondary | ICD-10-CM | POA: Diagnosis not present

## 2017-08-04 DIAGNOSIS — N186 End stage renal disease: Secondary | ICD-10-CM | POA: Diagnosis not present

## 2017-08-06 DIAGNOSIS — E1129 Type 2 diabetes mellitus with other diabetic kidney complication: Secondary | ICD-10-CM | POA: Diagnosis not present

## 2017-08-06 DIAGNOSIS — E875 Hyperkalemia: Secondary | ICD-10-CM | POA: Diagnosis not present

## 2017-08-06 DIAGNOSIS — N186 End stage renal disease: Secondary | ICD-10-CM | POA: Diagnosis not present

## 2017-08-06 DIAGNOSIS — D631 Anemia in chronic kidney disease: Secondary | ICD-10-CM | POA: Diagnosis not present

## 2017-08-06 DIAGNOSIS — D509 Iron deficiency anemia, unspecified: Secondary | ICD-10-CM | POA: Diagnosis not present

## 2017-08-06 DIAGNOSIS — N2581 Secondary hyperparathyroidism of renal origin: Secondary | ICD-10-CM | POA: Diagnosis not present

## 2017-08-07 DIAGNOSIS — N186 End stage renal disease: Secondary | ICD-10-CM | POA: Diagnosis not present

## 2017-08-07 DIAGNOSIS — M545 Low back pain: Secondary | ICD-10-CM | POA: Diagnosis not present

## 2017-08-07 DIAGNOSIS — I509 Heart failure, unspecified: Secondary | ICD-10-CM | POA: Diagnosis not present

## 2017-08-07 DIAGNOSIS — I132 Hypertensive heart and chronic kidney disease with heart failure and with stage 5 chronic kidney disease, or end stage renal disease: Secondary | ICD-10-CM | POA: Diagnosis not present

## 2017-08-07 DIAGNOSIS — J449 Chronic obstructive pulmonary disease, unspecified: Secondary | ICD-10-CM | POA: Diagnosis not present

## 2017-08-07 DIAGNOSIS — E1122 Type 2 diabetes mellitus with diabetic chronic kidney disease: Secondary | ICD-10-CM | POA: Diagnosis not present

## 2017-08-08 DIAGNOSIS — D631 Anemia in chronic kidney disease: Secondary | ICD-10-CM | POA: Diagnosis not present

## 2017-08-08 DIAGNOSIS — E875 Hyperkalemia: Secondary | ICD-10-CM | POA: Diagnosis not present

## 2017-08-08 DIAGNOSIS — D509 Iron deficiency anemia, unspecified: Secondary | ICD-10-CM | POA: Diagnosis not present

## 2017-08-08 DIAGNOSIS — E1129 Type 2 diabetes mellitus with other diabetic kidney complication: Secondary | ICD-10-CM | POA: Diagnosis not present

## 2017-08-08 DIAGNOSIS — N2581 Secondary hyperparathyroidism of renal origin: Secondary | ICD-10-CM | POA: Diagnosis not present

## 2017-08-08 DIAGNOSIS — N186 End stage renal disease: Secondary | ICD-10-CM | POA: Diagnosis not present

## 2017-08-11 DIAGNOSIS — D631 Anemia in chronic kidney disease: Secondary | ICD-10-CM | POA: Diagnosis not present

## 2017-08-11 DIAGNOSIS — D509 Iron deficiency anemia, unspecified: Secondary | ICD-10-CM | POA: Diagnosis not present

## 2017-08-11 DIAGNOSIS — E875 Hyperkalemia: Secondary | ICD-10-CM | POA: Diagnosis not present

## 2017-08-11 DIAGNOSIS — E1129 Type 2 diabetes mellitus with other diabetic kidney complication: Secondary | ICD-10-CM | POA: Diagnosis not present

## 2017-08-11 DIAGNOSIS — N2581 Secondary hyperparathyroidism of renal origin: Secondary | ICD-10-CM | POA: Diagnosis not present

## 2017-08-11 DIAGNOSIS — N186 End stage renal disease: Secondary | ICD-10-CM | POA: Diagnosis not present

## 2017-08-12 DIAGNOSIS — M545 Low back pain: Secondary | ICD-10-CM | POA: Diagnosis not present

## 2017-08-12 DIAGNOSIS — J449 Chronic obstructive pulmonary disease, unspecified: Secondary | ICD-10-CM | POA: Diagnosis not present

## 2017-08-12 DIAGNOSIS — E1122 Type 2 diabetes mellitus with diabetic chronic kidney disease: Secondary | ICD-10-CM | POA: Diagnosis not present

## 2017-08-12 DIAGNOSIS — I509 Heart failure, unspecified: Secondary | ICD-10-CM | POA: Diagnosis not present

## 2017-08-12 DIAGNOSIS — I132 Hypertensive heart and chronic kidney disease with heart failure and with stage 5 chronic kidney disease, or end stage renal disease: Secondary | ICD-10-CM | POA: Diagnosis not present

## 2017-08-12 DIAGNOSIS — N186 End stage renal disease: Secondary | ICD-10-CM | POA: Diagnosis not present

## 2017-08-13 DIAGNOSIS — E875 Hyperkalemia: Secondary | ICD-10-CM | POA: Diagnosis not present

## 2017-08-13 DIAGNOSIS — D631 Anemia in chronic kidney disease: Secondary | ICD-10-CM | POA: Diagnosis not present

## 2017-08-13 DIAGNOSIS — N2581 Secondary hyperparathyroidism of renal origin: Secondary | ICD-10-CM | POA: Diagnosis not present

## 2017-08-13 DIAGNOSIS — N186 End stage renal disease: Secondary | ICD-10-CM | POA: Diagnosis not present

## 2017-08-13 DIAGNOSIS — D509 Iron deficiency anemia, unspecified: Secondary | ICD-10-CM | POA: Diagnosis not present

## 2017-08-13 DIAGNOSIS — E1129 Type 2 diabetes mellitus with other diabetic kidney complication: Secondary | ICD-10-CM | POA: Diagnosis not present

## 2017-08-14 DIAGNOSIS — I509 Heart failure, unspecified: Secondary | ICD-10-CM | POA: Diagnosis not present

## 2017-08-14 DIAGNOSIS — N186 End stage renal disease: Secondary | ICD-10-CM | POA: Diagnosis not present

## 2017-08-14 DIAGNOSIS — M545 Low back pain: Secondary | ICD-10-CM | POA: Diagnosis not present

## 2017-08-14 DIAGNOSIS — I132 Hypertensive heart and chronic kidney disease with heart failure and with stage 5 chronic kidney disease, or end stage renal disease: Secondary | ICD-10-CM | POA: Diagnosis not present

## 2017-08-14 DIAGNOSIS — J449 Chronic obstructive pulmonary disease, unspecified: Secondary | ICD-10-CM | POA: Diagnosis not present

## 2017-08-14 DIAGNOSIS — E1122 Type 2 diabetes mellitus with diabetic chronic kidney disease: Secondary | ICD-10-CM | POA: Diagnosis not present

## 2017-08-15 DIAGNOSIS — I158 Other secondary hypertension: Secondary | ICD-10-CM | POA: Diagnosis not present

## 2017-08-15 DIAGNOSIS — E1129 Type 2 diabetes mellitus with other diabetic kidney complication: Secondary | ICD-10-CM | POA: Diagnosis not present

## 2017-08-15 DIAGNOSIS — Z992 Dependence on renal dialysis: Secondary | ICD-10-CM | POA: Diagnosis not present

## 2017-08-15 DIAGNOSIS — N2581 Secondary hyperparathyroidism of renal origin: Secondary | ICD-10-CM | POA: Diagnosis not present

## 2017-08-15 DIAGNOSIS — D631 Anemia in chronic kidney disease: Secondary | ICD-10-CM | POA: Diagnosis not present

## 2017-08-15 DIAGNOSIS — E875 Hyperkalemia: Secondary | ICD-10-CM | POA: Diagnosis not present

## 2017-08-15 DIAGNOSIS — N186 End stage renal disease: Secondary | ICD-10-CM | POA: Diagnosis not present

## 2017-08-15 DIAGNOSIS — D509 Iron deficiency anemia, unspecified: Secondary | ICD-10-CM | POA: Diagnosis not present

## 2017-08-18 DIAGNOSIS — D631 Anemia in chronic kidney disease: Secondary | ICD-10-CM | POA: Diagnosis not present

## 2017-08-18 DIAGNOSIS — N186 End stage renal disease: Secondary | ICD-10-CM | POA: Diagnosis not present

## 2017-08-18 DIAGNOSIS — E875 Hyperkalemia: Secondary | ICD-10-CM | POA: Diagnosis not present

## 2017-08-18 DIAGNOSIS — D509 Iron deficiency anemia, unspecified: Secondary | ICD-10-CM | POA: Diagnosis not present

## 2017-08-18 DIAGNOSIS — N2581 Secondary hyperparathyroidism of renal origin: Secondary | ICD-10-CM | POA: Diagnosis not present

## 2017-08-18 DIAGNOSIS — E1129 Type 2 diabetes mellitus with other diabetic kidney complication: Secondary | ICD-10-CM | POA: Diagnosis not present

## 2017-08-19 DIAGNOSIS — M545 Low back pain: Secondary | ICD-10-CM | POA: Diagnosis not present

## 2017-08-19 DIAGNOSIS — E1122 Type 2 diabetes mellitus with diabetic chronic kidney disease: Secondary | ICD-10-CM | POA: Diagnosis not present

## 2017-08-19 DIAGNOSIS — N186 End stage renal disease: Secondary | ICD-10-CM | POA: Diagnosis not present

## 2017-08-19 DIAGNOSIS — J449 Chronic obstructive pulmonary disease, unspecified: Secondary | ICD-10-CM | POA: Diagnosis not present

## 2017-08-19 DIAGNOSIS — I132 Hypertensive heart and chronic kidney disease with heart failure and with stage 5 chronic kidney disease, or end stage renal disease: Secondary | ICD-10-CM | POA: Diagnosis not present

## 2017-08-19 DIAGNOSIS — I509 Heart failure, unspecified: Secondary | ICD-10-CM | POA: Diagnosis not present

## 2017-08-20 DIAGNOSIS — N2581 Secondary hyperparathyroidism of renal origin: Secondary | ICD-10-CM | POA: Diagnosis not present

## 2017-08-20 DIAGNOSIS — E1129 Type 2 diabetes mellitus with other diabetic kidney complication: Secondary | ICD-10-CM | POA: Diagnosis not present

## 2017-08-20 DIAGNOSIS — D509 Iron deficiency anemia, unspecified: Secondary | ICD-10-CM | POA: Diagnosis not present

## 2017-08-20 DIAGNOSIS — D631 Anemia in chronic kidney disease: Secondary | ICD-10-CM | POA: Diagnosis not present

## 2017-08-20 DIAGNOSIS — E875 Hyperkalemia: Secondary | ICD-10-CM | POA: Diagnosis not present

## 2017-08-20 DIAGNOSIS — N186 End stage renal disease: Secondary | ICD-10-CM | POA: Diagnosis not present

## 2017-08-21 DIAGNOSIS — M6281 Muscle weakness (generalized): Secondary | ICD-10-CM | POA: Diagnosis not present

## 2017-08-21 DIAGNOSIS — I132 Hypertensive heart and chronic kidney disease with heart failure and with stage 5 chronic kidney disease, or end stage renal disease: Secondary | ICD-10-CM | POA: Diagnosis not present

## 2017-08-21 DIAGNOSIS — J449 Chronic obstructive pulmonary disease, unspecified: Secondary | ICD-10-CM | POA: Diagnosis not present

## 2017-08-21 DIAGNOSIS — N186 End stage renal disease: Secondary | ICD-10-CM | POA: Diagnosis not present

## 2017-08-21 DIAGNOSIS — K219 Gastro-esophageal reflux disease without esophagitis: Secondary | ICD-10-CM | POA: Diagnosis not present

## 2017-08-21 DIAGNOSIS — Z992 Dependence on renal dialysis: Secondary | ICD-10-CM | POA: Diagnosis not present

## 2017-08-21 DIAGNOSIS — M1990 Unspecified osteoarthritis, unspecified site: Secondary | ICD-10-CM | POA: Diagnosis not present

## 2017-08-21 DIAGNOSIS — I509 Heart failure, unspecified: Secondary | ICD-10-CM | POA: Diagnosis not present

## 2017-08-21 DIAGNOSIS — Z794 Long term (current) use of insulin: Secondary | ICD-10-CM | POA: Diagnosis not present

## 2017-08-21 DIAGNOSIS — E1122 Type 2 diabetes mellitus with diabetic chronic kidney disease: Secondary | ICD-10-CM | POA: Diagnosis not present

## 2017-08-21 DIAGNOSIS — Z8673 Personal history of transient ischemic attack (TIA), and cerebral infarction without residual deficits: Secondary | ICD-10-CM | POA: Diagnosis not present

## 2017-08-21 DIAGNOSIS — M545 Low back pain: Secondary | ICD-10-CM | POA: Diagnosis not present

## 2017-08-21 DIAGNOSIS — Z7901 Long term (current) use of anticoagulants: Secondary | ICD-10-CM | POA: Diagnosis not present

## 2017-08-22 DIAGNOSIS — D631 Anemia in chronic kidney disease: Secondary | ICD-10-CM | POA: Diagnosis not present

## 2017-08-22 DIAGNOSIS — E1129 Type 2 diabetes mellitus with other diabetic kidney complication: Secondary | ICD-10-CM | POA: Diagnosis not present

## 2017-08-22 DIAGNOSIS — N186 End stage renal disease: Secondary | ICD-10-CM | POA: Diagnosis not present

## 2017-08-22 DIAGNOSIS — D509 Iron deficiency anemia, unspecified: Secondary | ICD-10-CM | POA: Diagnosis not present

## 2017-08-22 DIAGNOSIS — E875 Hyperkalemia: Secondary | ICD-10-CM | POA: Diagnosis not present

## 2017-08-22 DIAGNOSIS — N2581 Secondary hyperparathyroidism of renal origin: Secondary | ICD-10-CM | POA: Diagnosis not present

## 2017-08-25 DIAGNOSIS — E875 Hyperkalemia: Secondary | ICD-10-CM | POA: Diagnosis not present

## 2017-08-25 DIAGNOSIS — D631 Anemia in chronic kidney disease: Secondary | ICD-10-CM | POA: Diagnosis not present

## 2017-08-25 DIAGNOSIS — D509 Iron deficiency anemia, unspecified: Secondary | ICD-10-CM | POA: Diagnosis not present

## 2017-08-25 DIAGNOSIS — E1129 Type 2 diabetes mellitus with other diabetic kidney complication: Secondary | ICD-10-CM | POA: Diagnosis not present

## 2017-08-25 DIAGNOSIS — N2581 Secondary hyperparathyroidism of renal origin: Secondary | ICD-10-CM | POA: Diagnosis not present

## 2017-08-25 DIAGNOSIS — N186 End stage renal disease: Secondary | ICD-10-CM | POA: Diagnosis not present

## 2017-08-26 DIAGNOSIS — E1122 Type 2 diabetes mellitus with diabetic chronic kidney disease: Secondary | ICD-10-CM | POA: Diagnosis not present

## 2017-08-26 DIAGNOSIS — I509 Heart failure, unspecified: Secondary | ICD-10-CM | POA: Diagnosis not present

## 2017-08-26 DIAGNOSIS — M6281 Muscle weakness (generalized): Secondary | ICD-10-CM | POA: Diagnosis not present

## 2017-08-26 DIAGNOSIS — N186 End stage renal disease: Secondary | ICD-10-CM | POA: Diagnosis not present

## 2017-08-26 DIAGNOSIS — M545 Low back pain: Secondary | ICD-10-CM | POA: Diagnosis not present

## 2017-08-26 DIAGNOSIS — I132 Hypertensive heart and chronic kidney disease with heart failure and with stage 5 chronic kidney disease, or end stage renal disease: Secondary | ICD-10-CM | POA: Diagnosis not present

## 2017-08-27 DIAGNOSIS — D509 Iron deficiency anemia, unspecified: Secondary | ICD-10-CM | POA: Diagnosis not present

## 2017-08-27 DIAGNOSIS — E875 Hyperkalemia: Secondary | ICD-10-CM | POA: Diagnosis not present

## 2017-08-27 DIAGNOSIS — N186 End stage renal disease: Secondary | ICD-10-CM | POA: Diagnosis not present

## 2017-08-27 DIAGNOSIS — D631 Anemia in chronic kidney disease: Secondary | ICD-10-CM | POA: Diagnosis not present

## 2017-08-27 DIAGNOSIS — N2581 Secondary hyperparathyroidism of renal origin: Secondary | ICD-10-CM | POA: Diagnosis not present

## 2017-08-27 DIAGNOSIS — E1129 Type 2 diabetes mellitus with other diabetic kidney complication: Secondary | ICD-10-CM | POA: Diagnosis not present

## 2017-08-29 DIAGNOSIS — D631 Anemia in chronic kidney disease: Secondary | ICD-10-CM | POA: Diagnosis not present

## 2017-08-29 DIAGNOSIS — N2581 Secondary hyperparathyroidism of renal origin: Secondary | ICD-10-CM | POA: Diagnosis not present

## 2017-08-29 DIAGNOSIS — E1129 Type 2 diabetes mellitus with other diabetic kidney complication: Secondary | ICD-10-CM | POA: Diagnosis not present

## 2017-08-29 DIAGNOSIS — N186 End stage renal disease: Secondary | ICD-10-CM | POA: Diagnosis not present

## 2017-08-29 DIAGNOSIS — D509 Iron deficiency anemia, unspecified: Secondary | ICD-10-CM | POA: Diagnosis not present

## 2017-08-29 DIAGNOSIS — E875 Hyperkalemia: Secondary | ICD-10-CM | POA: Diagnosis not present

## 2017-09-01 DIAGNOSIS — D631 Anemia in chronic kidney disease: Secondary | ICD-10-CM | POA: Diagnosis not present

## 2017-09-01 DIAGNOSIS — D509 Iron deficiency anemia, unspecified: Secondary | ICD-10-CM | POA: Diagnosis not present

## 2017-09-01 DIAGNOSIS — N2581 Secondary hyperparathyroidism of renal origin: Secondary | ICD-10-CM | POA: Diagnosis not present

## 2017-09-01 DIAGNOSIS — E875 Hyperkalemia: Secondary | ICD-10-CM | POA: Diagnosis not present

## 2017-09-01 DIAGNOSIS — N186 End stage renal disease: Secondary | ICD-10-CM | POA: Diagnosis not present

## 2017-09-01 DIAGNOSIS — E1129 Type 2 diabetes mellitus with other diabetic kidney complication: Secondary | ICD-10-CM | POA: Diagnosis not present

## 2017-09-02 DIAGNOSIS — M6281 Muscle weakness (generalized): Secondary | ICD-10-CM | POA: Diagnosis not present

## 2017-09-02 DIAGNOSIS — I509 Heart failure, unspecified: Secondary | ICD-10-CM | POA: Diagnosis not present

## 2017-09-02 DIAGNOSIS — E1122 Type 2 diabetes mellitus with diabetic chronic kidney disease: Secondary | ICD-10-CM | POA: Diagnosis not present

## 2017-09-02 DIAGNOSIS — I132 Hypertensive heart and chronic kidney disease with heart failure and with stage 5 chronic kidney disease, or end stage renal disease: Secondary | ICD-10-CM | POA: Diagnosis not present

## 2017-09-02 DIAGNOSIS — R739 Hyperglycemia, unspecified: Secondary | ICD-10-CM | POA: Diagnosis not present

## 2017-09-02 DIAGNOSIS — M545 Low back pain: Secondary | ICD-10-CM | POA: Diagnosis not present

## 2017-09-02 DIAGNOSIS — N186 End stage renal disease: Secondary | ICD-10-CM | POA: Diagnosis not present

## 2017-09-03 DIAGNOSIS — D509 Iron deficiency anemia, unspecified: Secondary | ICD-10-CM | POA: Diagnosis not present

## 2017-09-03 DIAGNOSIS — D631 Anemia in chronic kidney disease: Secondary | ICD-10-CM | POA: Diagnosis not present

## 2017-09-03 DIAGNOSIS — E875 Hyperkalemia: Secondary | ICD-10-CM | POA: Diagnosis not present

## 2017-09-03 DIAGNOSIS — N186 End stage renal disease: Secondary | ICD-10-CM | POA: Diagnosis not present

## 2017-09-03 DIAGNOSIS — E1129 Type 2 diabetes mellitus with other diabetic kidney complication: Secondary | ICD-10-CM | POA: Diagnosis not present

## 2017-09-03 DIAGNOSIS — N2581 Secondary hyperparathyroidism of renal origin: Secondary | ICD-10-CM | POA: Diagnosis not present

## 2017-09-03 DIAGNOSIS — E162 Hypoglycemia, unspecified: Secondary | ICD-10-CM | POA: Diagnosis not present

## 2017-09-03 DIAGNOSIS — E161 Other hypoglycemia: Secondary | ICD-10-CM | POA: Diagnosis not present

## 2017-09-04 DIAGNOSIS — E1122 Type 2 diabetes mellitus with diabetic chronic kidney disease: Secondary | ICD-10-CM | POA: Diagnosis not present

## 2017-09-04 DIAGNOSIS — I132 Hypertensive heart and chronic kidney disease with heart failure and with stage 5 chronic kidney disease, or end stage renal disease: Secondary | ICD-10-CM | POA: Diagnosis not present

## 2017-09-04 DIAGNOSIS — I509 Heart failure, unspecified: Secondary | ICD-10-CM | POA: Diagnosis not present

## 2017-09-04 DIAGNOSIS — M6281 Muscle weakness (generalized): Secondary | ICD-10-CM | POA: Diagnosis not present

## 2017-09-04 DIAGNOSIS — N186 End stage renal disease: Secondary | ICD-10-CM | POA: Diagnosis not present

## 2017-09-04 DIAGNOSIS — M545 Low back pain: Secondary | ICD-10-CM | POA: Diagnosis not present

## 2017-09-05 DIAGNOSIS — D509 Iron deficiency anemia, unspecified: Secondary | ICD-10-CM | POA: Diagnosis not present

## 2017-09-05 DIAGNOSIS — E875 Hyperkalemia: Secondary | ICD-10-CM | POA: Diagnosis not present

## 2017-09-05 DIAGNOSIS — N186 End stage renal disease: Secondary | ICD-10-CM | POA: Diagnosis not present

## 2017-09-05 DIAGNOSIS — D631 Anemia in chronic kidney disease: Secondary | ICD-10-CM | POA: Diagnosis not present

## 2017-09-05 DIAGNOSIS — N2581 Secondary hyperparathyroidism of renal origin: Secondary | ICD-10-CM | POA: Diagnosis not present

## 2017-09-05 DIAGNOSIS — E1129 Type 2 diabetes mellitus with other diabetic kidney complication: Secondary | ICD-10-CM | POA: Diagnosis not present

## 2017-09-08 DIAGNOSIS — D509 Iron deficiency anemia, unspecified: Secondary | ICD-10-CM | POA: Diagnosis not present

## 2017-09-08 DIAGNOSIS — N186 End stage renal disease: Secondary | ICD-10-CM | POA: Diagnosis not present

## 2017-09-08 DIAGNOSIS — D631 Anemia in chronic kidney disease: Secondary | ICD-10-CM | POA: Diagnosis not present

## 2017-09-08 DIAGNOSIS — N2581 Secondary hyperparathyroidism of renal origin: Secondary | ICD-10-CM | POA: Diagnosis not present

## 2017-09-08 DIAGNOSIS — E1129 Type 2 diabetes mellitus with other diabetic kidney complication: Secondary | ICD-10-CM | POA: Diagnosis not present

## 2017-09-08 DIAGNOSIS — E875 Hyperkalemia: Secondary | ICD-10-CM | POA: Diagnosis not present

## 2017-09-09 DIAGNOSIS — I132 Hypertensive heart and chronic kidney disease with heart failure and with stage 5 chronic kidney disease, or end stage renal disease: Secondary | ICD-10-CM | POA: Diagnosis not present

## 2017-09-09 DIAGNOSIS — I509 Heart failure, unspecified: Secondary | ICD-10-CM | POA: Diagnosis not present

## 2017-09-09 DIAGNOSIS — E1122 Type 2 diabetes mellitus with diabetic chronic kidney disease: Secondary | ICD-10-CM | POA: Diagnosis not present

## 2017-09-09 DIAGNOSIS — I10 Essential (primary) hypertension: Secondary | ICD-10-CM | POA: Diagnosis not present

## 2017-09-09 DIAGNOSIS — E119 Type 2 diabetes mellitus without complications: Secondary | ICD-10-CM | POA: Diagnosis not present

## 2017-09-09 DIAGNOSIS — N186 End stage renal disease: Secondary | ICD-10-CM | POA: Diagnosis not present

## 2017-09-09 DIAGNOSIS — M545 Low back pain: Secondary | ICD-10-CM | POA: Diagnosis not present

## 2017-09-09 DIAGNOSIS — M6281 Muscle weakness (generalized): Secondary | ICD-10-CM | POA: Diagnosis not present

## 2017-09-10 DIAGNOSIS — D631 Anemia in chronic kidney disease: Secondary | ICD-10-CM | POA: Diagnosis not present

## 2017-09-10 DIAGNOSIS — E875 Hyperkalemia: Secondary | ICD-10-CM | POA: Diagnosis not present

## 2017-09-10 DIAGNOSIS — D509 Iron deficiency anemia, unspecified: Secondary | ICD-10-CM | POA: Diagnosis not present

## 2017-09-10 DIAGNOSIS — N2581 Secondary hyperparathyroidism of renal origin: Secondary | ICD-10-CM | POA: Diagnosis not present

## 2017-09-10 DIAGNOSIS — E1129 Type 2 diabetes mellitus with other diabetic kidney complication: Secondary | ICD-10-CM | POA: Diagnosis not present

## 2017-09-10 DIAGNOSIS — N186 End stage renal disease: Secondary | ICD-10-CM | POA: Diagnosis not present

## 2017-09-11 DIAGNOSIS — N186 End stage renal disease: Secondary | ICD-10-CM | POA: Diagnosis not present

## 2017-09-11 DIAGNOSIS — I132 Hypertensive heart and chronic kidney disease with heart failure and with stage 5 chronic kidney disease, or end stage renal disease: Secondary | ICD-10-CM | POA: Diagnosis not present

## 2017-09-11 DIAGNOSIS — I509 Heart failure, unspecified: Secondary | ICD-10-CM | POA: Diagnosis not present

## 2017-09-11 DIAGNOSIS — M6281 Muscle weakness (generalized): Secondary | ICD-10-CM | POA: Diagnosis not present

## 2017-09-11 DIAGNOSIS — E1122 Type 2 diabetes mellitus with diabetic chronic kidney disease: Secondary | ICD-10-CM | POA: Diagnosis not present

## 2017-09-11 DIAGNOSIS — M545 Low back pain: Secondary | ICD-10-CM | POA: Diagnosis not present

## 2017-09-12 DIAGNOSIS — E1129 Type 2 diabetes mellitus with other diabetic kidney complication: Secondary | ICD-10-CM | POA: Diagnosis not present

## 2017-09-12 DIAGNOSIS — N2581 Secondary hyperparathyroidism of renal origin: Secondary | ICD-10-CM | POA: Diagnosis not present

## 2017-09-12 DIAGNOSIS — N186 End stage renal disease: Secondary | ICD-10-CM | POA: Diagnosis not present

## 2017-09-12 DIAGNOSIS — E875 Hyperkalemia: Secondary | ICD-10-CM | POA: Diagnosis not present

## 2017-09-12 DIAGNOSIS — D509 Iron deficiency anemia, unspecified: Secondary | ICD-10-CM | POA: Diagnosis not present

## 2017-09-12 DIAGNOSIS — D631 Anemia in chronic kidney disease: Secondary | ICD-10-CM | POA: Diagnosis not present

## 2017-09-14 DIAGNOSIS — Z992 Dependence on renal dialysis: Secondary | ICD-10-CM | POA: Diagnosis not present

## 2017-09-14 DIAGNOSIS — N186 End stage renal disease: Secondary | ICD-10-CM | POA: Diagnosis not present

## 2017-09-14 DIAGNOSIS — I158 Other secondary hypertension: Secondary | ICD-10-CM | POA: Diagnosis not present

## 2017-09-15 DIAGNOSIS — N2581 Secondary hyperparathyroidism of renal origin: Secondary | ICD-10-CM | POA: Diagnosis not present

## 2017-09-15 DIAGNOSIS — E875 Hyperkalemia: Secondary | ICD-10-CM | POA: Diagnosis not present

## 2017-09-15 DIAGNOSIS — K56609 Unspecified intestinal obstruction, unspecified as to partial versus complete obstruction: Secondary | ICD-10-CM

## 2017-09-15 DIAGNOSIS — N186 End stage renal disease: Secondary | ICD-10-CM | POA: Diagnosis not present

## 2017-09-15 DIAGNOSIS — E1129 Type 2 diabetes mellitus with other diabetic kidney complication: Secondary | ICD-10-CM | POA: Diagnosis not present

## 2017-09-15 HISTORY — DX: Unspecified intestinal obstruction, unspecified as to partial versus complete obstruction: K56.609

## 2017-09-16 DIAGNOSIS — M6281 Muscle weakness (generalized): Secondary | ICD-10-CM | POA: Diagnosis not present

## 2017-09-16 DIAGNOSIS — I509 Heart failure, unspecified: Secondary | ICD-10-CM | POA: Diagnosis not present

## 2017-09-16 DIAGNOSIS — N186 End stage renal disease: Secondary | ICD-10-CM | POA: Diagnosis not present

## 2017-09-16 DIAGNOSIS — E1122 Type 2 diabetes mellitus with diabetic chronic kidney disease: Secondary | ICD-10-CM | POA: Diagnosis not present

## 2017-09-16 DIAGNOSIS — M545 Low back pain: Secondary | ICD-10-CM | POA: Diagnosis not present

## 2017-09-16 DIAGNOSIS — I132 Hypertensive heart and chronic kidney disease with heart failure and with stage 5 chronic kidney disease, or end stage renal disease: Secondary | ICD-10-CM | POA: Diagnosis not present

## 2017-09-17 DIAGNOSIS — N186 End stage renal disease: Secondary | ICD-10-CM | POA: Diagnosis not present

## 2017-09-17 DIAGNOSIS — E1129 Type 2 diabetes mellitus with other diabetic kidney complication: Secondary | ICD-10-CM | POA: Diagnosis not present

## 2017-09-17 DIAGNOSIS — N2581 Secondary hyperparathyroidism of renal origin: Secondary | ICD-10-CM | POA: Diagnosis not present

## 2017-09-17 DIAGNOSIS — E875 Hyperkalemia: Secondary | ICD-10-CM | POA: Diagnosis not present

## 2017-09-18 DIAGNOSIS — M6281 Muscle weakness (generalized): Secondary | ICD-10-CM | POA: Diagnosis not present

## 2017-09-18 DIAGNOSIS — N186 End stage renal disease: Secondary | ICD-10-CM | POA: Diagnosis not present

## 2017-09-18 DIAGNOSIS — I132 Hypertensive heart and chronic kidney disease with heart failure and with stage 5 chronic kidney disease, or end stage renal disease: Secondary | ICD-10-CM | POA: Diagnosis not present

## 2017-09-18 DIAGNOSIS — M545 Low back pain: Secondary | ICD-10-CM | POA: Diagnosis not present

## 2017-09-18 DIAGNOSIS — E1122 Type 2 diabetes mellitus with diabetic chronic kidney disease: Secondary | ICD-10-CM | POA: Diagnosis not present

## 2017-09-18 DIAGNOSIS — I509 Heart failure, unspecified: Secondary | ICD-10-CM | POA: Diagnosis not present

## 2017-09-19 DIAGNOSIS — N186 End stage renal disease: Secondary | ICD-10-CM | POA: Diagnosis not present

## 2017-09-19 DIAGNOSIS — E875 Hyperkalemia: Secondary | ICD-10-CM | POA: Diagnosis not present

## 2017-09-19 DIAGNOSIS — E1129 Type 2 diabetes mellitus with other diabetic kidney complication: Secondary | ICD-10-CM | POA: Diagnosis not present

## 2017-09-19 DIAGNOSIS — N2581 Secondary hyperparathyroidism of renal origin: Secondary | ICD-10-CM | POA: Diagnosis not present

## 2017-09-22 ENCOUNTER — Emergency Department (HOSPITAL_COMMUNITY)
Admission: EM | Admit: 2017-09-22 | Discharge: 2017-09-22 | Disposition: A | Discharge status: Home / Self Care | Payer: Medicare Other | Source: Home / Self Care

## 2017-09-22 ENCOUNTER — Encounter (HOSPITAL_COMMUNITY): Payer: Self-pay | Admitting: Emergency Medicine

## 2017-09-22 DIAGNOSIS — R7989 Other specified abnormal findings of blood chemistry: Secondary | ICD-10-CM | POA: Diagnosis not present

## 2017-09-22 DIAGNOSIS — I120 Hypertensive chronic kidney disease with stage 5 chronic kidney disease or end stage renal disease: Secondary | ICD-10-CM | POA: Diagnosis not present

## 2017-09-22 DIAGNOSIS — R1111 Vomiting without nausea: Secondary | ICD-10-CM | POA: Insufficient documentation

## 2017-09-22 DIAGNOSIS — N83201 Unspecified ovarian cyst, right side: Secondary | ICD-10-CM | POA: Diagnosis not present

## 2017-09-22 DIAGNOSIS — Z5321 Procedure and treatment not carried out due to patient leaving prior to being seen by health care provider: Secondary | ICD-10-CM

## 2017-09-22 DIAGNOSIS — R1032 Left lower quadrant pain: Secondary | ICD-10-CM | POA: Diagnosis not present

## 2017-09-22 DIAGNOSIS — D3502 Benign neoplasm of left adrenal gland: Secondary | ICD-10-CM | POA: Diagnosis not present

## 2017-09-22 DIAGNOSIS — N858 Other specified noninflammatory disorders of uterus: Secondary | ICD-10-CM | POA: Diagnosis not present

## 2017-09-22 DIAGNOSIS — E1129 Type 2 diabetes mellitus with other diabetic kidney complication: Secondary | ICD-10-CM | POA: Diagnosis not present

## 2017-09-22 DIAGNOSIS — N2581 Secondary hyperparathyroidism of renal origin: Secondary | ICD-10-CM | POA: Diagnosis not present

## 2017-09-22 DIAGNOSIS — N186 End stage renal disease: Secondary | ICD-10-CM | POA: Diagnosis not present

## 2017-09-22 DIAGNOSIS — E875 Hyperkalemia: Secondary | ICD-10-CM | POA: Diagnosis not present

## 2017-09-22 DIAGNOSIS — N839 Noninflammatory disorder of ovary, fallopian tube and broad ligament, unspecified: Secondary | ICD-10-CM | POA: Diagnosis not present

## 2017-09-22 DIAGNOSIS — K56699 Other intestinal obstruction unspecified as to partial versus complete obstruction: Secondary | ICD-10-CM | POA: Diagnosis not present

## 2017-09-22 HISTORY — DX: Disorder of kidney and ureter, unspecified: N28.9

## 2017-09-22 HISTORY — DX: Essential (primary) hypertension: I10

## 2017-09-22 HISTORY — DX: Type 2 diabetes mellitus without complications: E11.9

## 2017-09-22 HISTORY — DX: Heart failure, unspecified: I50.9

## 2017-09-22 LAB — COMPREHENSIVE METABOLIC PANEL
ALT: 23 U/L (ref 14–54)
AST: 27 U/L (ref 15–41)
Albumin: 3.7 g/dL (ref 3.5–5.0)
Alkaline Phosphatase: 200 U/L — ABNORMAL HIGH (ref 38–126)
Anion gap: 21 — ABNORMAL HIGH (ref 5–15)
BUN: 17 mg/dL (ref 6–20)
CO2: 22 mmol/L (ref 22–32)
Calcium: 8.9 mg/dL (ref 8.9–10.3)
Chloride: 89 mmol/L — ABNORMAL LOW (ref 101–111)
Creatinine, Ser: 7 mg/dL — ABNORMAL HIGH (ref 0.44–1.00)
GFR calc Af Amer: 6 mL/min — ABNORMAL LOW (ref >60)
GFR calc non Af Amer: 6 mL/min — ABNORMAL LOW (ref >60)
Glucose, Bld: 264 mg/dL — ABNORMAL HIGH (ref 65–99)
Potassium: 4.6 mmol/L (ref 3.5–5.1)
Sodium: 132 mmol/L — ABNORMAL LOW (ref 135–145)
Total Bilirubin: 0.9 mg/dL (ref 0.3–1.2)
Total Protein: 8.8 g/dL — ABNORMAL HIGH (ref 6.5–8.1)

## 2017-09-22 LAB — CBC
HCT: 46.3 % — ABNORMAL HIGH (ref 36.0–46.0)
Hemoglobin: 15.3 g/dL — ABNORMAL HIGH (ref 12.0–15.0)
MCH: 29.9 pg (ref 26.0–34.0)
MCHC: 33 g/dL (ref 30.0–36.0)
MCV: 90.6 fL (ref 78.0–100.0)
Platelets: 256 10*3/uL (ref 150–400)
RBC: 5.11 MIL/uL (ref 3.87–5.11)
RDW: 17.5 % — ABNORMAL HIGH (ref 11.5–15.5)
WBC: 16.7 10*3/uL — ABNORMAL HIGH (ref 4.0–10.5)

## 2017-09-22 LAB — CBG MONITORING, ED: Glucose-Capillary: 277 mg/dL — ABNORMAL HIGH (ref 65–99)

## 2017-09-22 LAB — LIPASE, BLOOD: Lipase: 18 U/L (ref 11–51)

## 2017-09-22 MED ORDER — ONDANSETRON HCL 4 MG/2ML IJ SOLN
4.0000 mg | Freq: Once | INTRAMUSCULAR | Status: AC
  Administered 2017-09-22: 4 mg via INTRAVENOUS

## 2017-09-22 MED ORDER — ONDANSETRON HCL 4 MG/2ML IJ SOLN
INTRAMUSCULAR | Status: AC
  Filled 2017-09-22: qty 2

## 2017-09-22 NOTE — Plan of Care (Signed)
  Transfer from [facility] to telemetry bed at [facility]. Ms. Caitlin Stone is a 66 year old female with pmh of ESRD on HD(M/W/F), HTN, DM type II, SBO, and CHF; who presented to complains of abdominal pain with nausea and vomiting for last 2 days. Last bowel movement 24 hours ago still passing flatus. Patient with previous history of lysis of adhesions removal gallbladder. CT scan showing small bowel obstruction with transition in the lower mid to right anterior pelvis as well as some cystic lesion could represent neoplasm over right ovary and needs further evaluation. Labs revealed WBC 14, hemoglobin 15, potassium 5.3, BUN 24, creatinine 7.7, glucose 365, lipase within normal limits, troponin 0.1. EKG was noted to be showing no significant signs of ischemia and patient not complaining of any chest pain. Dr. Donne Hazel of general surgery consulted here Caitlin Stone recommending transfer and admission to hospitalist group. NG tube to be placed prior to transport.

## 2017-09-22 NOTE — ED Triage Notes (Signed)
Pt arrives via POv from home with n/v/abdominal pain for the last 2 days. Pt vomiting clear liquids, c/o cramping pain. Pt finished full dialysis treatment. Denies recent fever.

## 2017-09-22 NOTE — ED Notes (Signed)
Grandson stated, she doesn't want to wait 5 hours like other people, I will take her to another hospital Tried to have the pt. To stay here.

## 2017-09-23 ENCOUNTER — Inpatient Hospital Stay (HOSPITAL_COMMUNITY): Payer: Medicare Other

## 2017-09-23 ENCOUNTER — Encounter (HOSPITAL_COMMUNITY): Payer: Self-pay | Admitting: Internal Medicine

## 2017-09-23 ENCOUNTER — Inpatient Hospital Stay (HOSPITAL_COMMUNITY)
Admission: EM | Admit: 2017-09-23 | Discharge: 2017-09-26 | DRG: 388 | Disposition: A | Discharge status: Home / Self Care | Payer: Medicare Other | Source: Other Acute Inpatient Hospital | Attending: Internal Medicine | Admitting: Internal Medicine

## 2017-09-23 DIAGNOSIS — N858 Other specified noninflammatory disorders of uterus: Secondary | ICD-10-CM | POA: Diagnosis not present

## 2017-09-23 DIAGNOSIS — K219 Gastro-esophageal reflux disease without esophagitis: Secondary | ICD-10-CM | POA: Diagnosis present

## 2017-09-23 DIAGNOSIS — N839 Noninflammatory disorder of ovary, fallopian tube and broad ligament, unspecified: Secondary | ICD-10-CM | POA: Diagnosis not present

## 2017-09-23 DIAGNOSIS — E875 Hyperkalemia: Secondary | ICD-10-CM

## 2017-09-23 DIAGNOSIS — Z9049 Acquired absence of other specified parts of digestive tract: Secondary | ICD-10-CM | POA: Diagnosis not present

## 2017-09-23 DIAGNOSIS — E1122 Type 2 diabetes mellitus with diabetic chronic kidney disease: Secondary | ICD-10-CM | POA: Diagnosis present

## 2017-09-23 DIAGNOSIS — D259 Leiomyoma of uterus, unspecified: Secondary | ICD-10-CM | POA: Diagnosis present

## 2017-09-23 DIAGNOSIS — R7989 Other specified abnormal findings of blood chemistry: Secondary | ICD-10-CM | POA: Diagnosis not present

## 2017-09-23 DIAGNOSIS — N938 Other specified abnormal uterine and vaginal bleeding: Secondary | ICD-10-CM | POA: Diagnosis not present

## 2017-09-23 DIAGNOSIS — Z86718 Personal history of other venous thrombosis and embolism: Secondary | ICD-10-CM | POA: Diagnosis not present

## 2017-09-23 DIAGNOSIS — N83201 Unspecified ovarian cyst, right side: Secondary | ICD-10-CM | POA: Diagnosis not present

## 2017-09-23 DIAGNOSIS — X58XXXA Exposure to other specified factors, initial encounter: Secondary | ICD-10-CM | POA: Diagnosis not present

## 2017-09-23 DIAGNOSIS — K56699 Other intestinal obstruction unspecified as to partial versus complete obstruction: Secondary | ICD-10-CM | POA: Diagnosis not present

## 2017-09-23 DIAGNOSIS — D631 Anemia in chronic kidney disease: Secondary | ICD-10-CM | POA: Diagnosis not present

## 2017-09-23 DIAGNOSIS — N186 End stage renal disease: Secondary | ICD-10-CM | POA: Diagnosis not present

## 2017-09-23 DIAGNOSIS — D3502 Benign neoplasm of left adrenal gland: Secondary | ICD-10-CM | POA: Diagnosis not present

## 2017-09-23 DIAGNOSIS — E871 Hypo-osmolality and hyponatremia: Secondary | ICD-10-CM | POA: Diagnosis present

## 2017-09-23 DIAGNOSIS — R109 Unspecified abdominal pain: Secondary | ICD-10-CM

## 2017-09-23 DIAGNOSIS — N939 Abnormal uterine and vaginal bleeding, unspecified: Secondary | ICD-10-CM | POA: Diagnosis not present

## 2017-09-23 DIAGNOSIS — D72829 Elevated white blood cell count, unspecified: Secondary | ICD-10-CM | POA: Diagnosis present

## 2017-09-23 DIAGNOSIS — Z8249 Family history of ischemic heart disease and other diseases of the circulatory system: Secondary | ICD-10-CM | POA: Diagnosis not present

## 2017-09-23 DIAGNOSIS — Z880 Allergy status to penicillin: Secondary | ICD-10-CM | POA: Diagnosis not present

## 2017-09-23 DIAGNOSIS — N2581 Secondary hyperparathyroidism of renal origin: Secondary | ICD-10-CM | POA: Diagnosis not present

## 2017-09-23 DIAGNOSIS — Z91040 Latex allergy status: Secondary | ICD-10-CM | POA: Diagnosis not present

## 2017-09-23 DIAGNOSIS — R0989 Other specified symptoms and signs involving the circulatory and respiratory systems: Secondary | ICD-10-CM | POA: Diagnosis present

## 2017-09-23 DIAGNOSIS — E118 Type 2 diabetes mellitus with unspecified complications: Secondary | ICD-10-CM | POA: Diagnosis present

## 2017-09-23 DIAGNOSIS — I509 Heart failure, unspecified: Secondary | ICD-10-CM | POA: Diagnosis present

## 2017-09-23 DIAGNOSIS — Z79899 Other long term (current) drug therapy: Secondary | ICD-10-CM

## 2017-09-23 DIAGNOSIS — K56609 Unspecified intestinal obstruction, unspecified as to partial versus complete obstruction: Principal | ICD-10-CM | POA: Diagnosis present

## 2017-09-23 DIAGNOSIS — R1032 Left lower quadrant pain: Secondary | ICD-10-CM | POA: Diagnosis not present

## 2017-09-23 DIAGNOSIS — R1084 Generalized abdominal pain: Secondary | ICD-10-CM | POA: Diagnosis not present

## 2017-09-23 DIAGNOSIS — Z833 Family history of diabetes mellitus: Secondary | ICD-10-CM | POA: Diagnosis not present

## 2017-09-23 DIAGNOSIS — Z4682 Encounter for fitting and adjustment of non-vascular catheter: Secondary | ICD-10-CM | POA: Diagnosis not present

## 2017-09-23 DIAGNOSIS — K56600 Partial intestinal obstruction, unspecified as to cause: Secondary | ICD-10-CM | POA: Diagnosis not present

## 2017-09-23 DIAGNOSIS — M109 Gout, unspecified: Secondary | ICD-10-CM | POA: Diagnosis present

## 2017-09-23 DIAGNOSIS — I10 Essential (primary) hypertension: Secondary | ICD-10-CM | POA: Diagnosis present

## 2017-09-23 DIAGNOSIS — N838 Other noninflammatory disorders of ovary, fallopian tube and broad ligament: Secondary | ICD-10-CM

## 2017-09-23 DIAGNOSIS — Z992 Dependence on renal dialysis: Secondary | ICD-10-CM | POA: Diagnosis not present

## 2017-09-23 DIAGNOSIS — I120 Hypertensive chronic kidney disease with stage 5 chronic kidney disease or end stage renal disease: Secondary | ICD-10-CM | POA: Diagnosis not present

## 2017-09-23 DIAGNOSIS — I132 Hypertensive heart and chronic kidney disease with heart failure and with stage 5 chronic kidney disease, or end stage renal disease: Secondary | ICD-10-CM | POA: Diagnosis present

## 2017-09-23 HISTORY — DX: Unspecified intestinal obstruction, unspecified as to partial versus complete obstruction: K56.609

## 2017-09-23 HISTORY — DX: Gout, unspecified: M10.9

## 2017-09-23 HISTORY — DX: Noninfective gastroenteritis and colitis, unspecified: K52.9

## 2017-09-23 LAB — COMPREHENSIVE METABOLIC PANEL
ALT: 23 U/L (ref 14–54)
AST: 23 U/L (ref 15–41)
Albumin: 3.4 g/dL — ABNORMAL LOW (ref 3.5–5.0)
Alkaline Phosphatase: 187 U/L — ABNORMAL HIGH (ref 38–126)
Anion gap: 19 — ABNORMAL HIGH (ref 5–15)
BUN: 31 mg/dL — ABNORMAL HIGH (ref 6–20)
CO2: 27 mmol/L (ref 22–32)
Calcium: 9 mg/dL (ref 8.9–10.3)
Chloride: 85 mmol/L — ABNORMAL LOW (ref 101–111)
Creatinine, Ser: 9.35 mg/dL — ABNORMAL HIGH (ref 0.44–1.00)
GFR calc Af Amer: 5 mL/min — ABNORMAL LOW (ref >60)
GFR calc non Af Amer: 4 mL/min — ABNORMAL LOW (ref >60)
Glucose, Bld: 341 mg/dL — ABNORMAL HIGH (ref 65–99)
Potassium: 5.9 mmol/L — ABNORMAL HIGH (ref 3.5–5.1)
Sodium: 131 mmol/L — ABNORMAL LOW (ref 135–145)
Total Bilirubin: 0.8 mg/dL (ref 0.3–1.2)
Total Protein: 8.3 g/dL — ABNORMAL HIGH (ref 6.5–8.1)

## 2017-09-23 LAB — CBC
HCT: 44.3 % (ref 36.0–46.0)
Hemoglobin: 14.5 g/dL (ref 12.0–15.0)
MCH: 29.3 pg (ref 26.0–34.0)
MCHC: 32.7 g/dL (ref 30.0–36.0)
MCV: 89.5 fL (ref 78.0–100.0)
Platelets: 260 10*3/uL (ref 150–400)
RBC: 4.95 MIL/uL (ref 3.87–5.11)
RDW: 17.6 % — ABNORMAL HIGH (ref 11.5–15.5)
WBC: 15.5 10*3/uL — ABNORMAL HIGH (ref 4.0–10.5)

## 2017-09-23 LAB — GLUCOSE, CAPILLARY
Glucose-Capillary: 388 mg/dL — ABNORMAL HIGH (ref 65–99)
Glucose-Capillary: 347 mg/dL — ABNORMAL HIGH (ref 65–99)
Glucose-Capillary: 319 mg/dL — ABNORMAL HIGH (ref 65–99)
Glucose-Capillary: 182 mg/dL — ABNORMAL HIGH (ref 65–99)
Glucose-Capillary: 136 mg/dL — ABNORMAL HIGH (ref 65–99)

## 2017-09-23 LAB — HIV ANTIBODY (ROUTINE TESTING W REFLEX): HIV Screen 4th Generation wRfx: NONREACTIVE

## 2017-09-23 LAB — MRSA PCR SCREENING: MRSA by PCR: NEGATIVE

## 2017-09-23 MED ORDER — ONDANSETRON HCL 4 MG/2ML IJ SOLN
INTRAMUSCULAR | Status: AC
  Administered 2017-09-23: 4 mg via INTRAVENOUS
  Filled 2017-09-23: qty 2

## 2017-09-23 MED ORDER — SODIUM CHLORIDE 0.9 % IV SOLN: 100.0000 mL | INTRAVENOUS | Status: DC | PRN

## 2017-09-23 MED ORDER — LIDOCAINE HCL (PF) 1 % IJ SOLN: 5.0000 mL | INTRAMUSCULAR | Status: DC | PRN

## 2017-09-23 MED ORDER — ACETAMINOPHEN 650 MG RE SUPP: 650.0000 mg | Freq: Four times a day (QID) | RECTAL | Status: DC | PRN

## 2017-09-23 MED ORDER — ACETAMINOPHEN 325 MG PO TABS
650.0000 mg | ORAL_TABLET | Freq: Four times a day (QID) | ORAL | Status: DC | PRN
  Administered 2017-09-24: 650 mg via ORAL
  Filled 2017-09-23: qty 2

## 2017-09-23 MED ORDER — INSULIN ASPART 100 UNIT/ML ~~LOC~~ SOLN
0.0000 [IU] | Freq: Three times a day (TID) | SUBCUTANEOUS | Status: DC
  Administered 2017-09-23: 7 [IU] via SUBCUTANEOUS
  Administered 2017-09-23: 2 [IU] via SUBCUTANEOUS
  Administered 2017-09-24: 9 [IU] via SUBCUTANEOUS
  Administered 2017-09-24: 5 [IU] via SUBCUTANEOUS
  Administered 2017-09-24: 3 [IU] via SUBCUTANEOUS
  Administered 2017-09-25: 2 [IU] via SUBCUTANEOUS
  Administered 2017-09-25 – 2017-09-26 (×2): 9 [IU] via SUBCUTANEOUS
  Administered 2017-09-26: 5 [IU] via SUBCUTANEOUS

## 2017-09-23 MED ORDER — SODIUM CHLORIDE 0.9 % IV SOLN
INTRAVENOUS | Status: AC
  Administered 2017-09-23 (×2): via INTRAVENOUS

## 2017-09-23 MED ORDER — LIDOCAINE VISCOUS 2 % MT SOLN
15.0000 mL | Freq: Once | OROMUCOSAL | Status: AC
  Administered 2017-09-23: 2 mL via OROMUCOSAL
  Filled 2017-09-23: qty 15

## 2017-09-23 MED ORDER — LIDOCAINE-PRILOCAINE 2.5-2.5 % EX CREA: 1.0000 "application " | TOPICAL_CREAM | CUTANEOUS | Status: DC | PRN

## 2017-09-23 MED ORDER — LIDOCAINE VISCOUS 2 % MT SOLN
OROMUCOSAL | Status: AC
  Administered 2017-09-23: 2 mL via OROMUCOSAL
  Filled 2017-09-23: qty 15

## 2017-09-23 MED ORDER — HYDROMORPHONE HCL 1 MG/ML IJ SOLN
1.0000 mg | INTRAMUSCULAR | Status: DC | PRN
Start: 2017-09-23 — End: 2017-09-26
  Administered 2017-09-23 – 2017-09-26 (×8): 1 mg via INTRAVENOUS
  Filled 2017-09-23 (×7): qty 1

## 2017-09-23 MED ORDER — HEPARIN SODIUM (PORCINE) 5000 UNIT/ML IJ SOLN
5000.0000 [IU] | Freq: Three times a day (TID) | INTRAMUSCULAR | Status: DC
  Administered 2017-09-23 – 2017-09-26 (×8): 5000 [IU] via SUBCUTANEOUS
  Filled 2017-09-23 (×9): qty 1

## 2017-09-23 MED ORDER — PENTAFLUOROPROP-TETRAFLUOROETH EX AERO: 1.0000 "application " | INHALATION_SPRAY | CUTANEOUS | Status: DC | PRN

## 2017-09-23 MED ORDER — DIATRIZOATE MEGLUMINE & SODIUM 66-10 % PO SOLN
90.0000 mL | Freq: Once | ORAL | Status: AC
  Administered 2017-09-23: 90 mL via NASOGASTRIC
  Filled 2017-09-23: qty 90

## 2017-09-23 MED ORDER — ONDANSETRON HCL 4 MG/2ML IJ SOLN
4.0000 mg | Freq: Four times a day (QID) | INTRAMUSCULAR | Status: DC | PRN
  Administered 2017-09-23 (×2): 4 mg via INTRAVENOUS
  Filled 2017-09-23: qty 2

## 2017-09-23 NOTE — Progress Notes (Signed)
Patient seen and examined at bedside, patient admitted after midnight, please see earlier detailed admission note by Norval Morton, MD. Briefly, patient presented with abdominal pain and vomiting, found to have a small bowel obstruction. General surgery following. NG tube planned for this morning with IR. NPO. Abdominal pain/nausea improved this morning.   Cordelia Poche, MD [facility] 09/23/2017, 9:04 AM Pager: [phone]

## 2017-09-23 NOTE — H&P (Addendum)
TRH H&P   Patient Demographics:    Caitlin Stone, is a 66 y.o. female  MRN: [id number]   DOB - Oct 16, 1951  Admit Date - 09/23/2017  Outpatient Primary MD for the patient is Fleet Contras, MD  Referring MD/NP/PA:  [facility]  Outpatient Specialists:  Fleet Contras (nephrologist)  Patient coming from: home=> [facility] ED  No chief complaint on file.  Abdominal pain   HPI:    Caitlin Stone  is a 66 y.o. female, w Hypertension, Dm2, ESRD on HD (M, W, F), prior ccy, lysis of adhesions,   apparently presents w c/o abdominal pain and n/v for the past 2 days.  Pt states that abdominal pain generalized. Denies fever, chills, diarrhea, brbpr, black stool.  Pt presented to ED , but wait was too long so went to [facility].  CT scan =>  SBO with transition in the lower mid to right anterior pelvis as well as a cystic lesion could reporesent neoplasm over the right ovary.  Wbc 14, Hgb 15, Bun/creat 24/7.7, apparently Dr. Donne Hazel notified and recommended general surgery consult at [facility] and NGT to low intermittent suction.  Pt could not tolerate NGT placement and will need IR placement in am.      Review of systems:    In addition to the HPI above,  No Fever-chills, No Headache, No changes with Vision or hearing, No problems swallowing food or Liquids, No Chest pain, Cough or Shortness of Breath,  No Blood in stool or Urine, No dysuria, No new skin rashes or bruises, No new joints pains-aches,  No new weakness, tingling, numbness in any extremity, No recent weight gain or loss, No polyuria, polydypsia or polyphagia, No significant Mental Stressors.  A full 10 point Review of Systems was done, except as stated above, all other Review of Systems were negative.   With Past History of the following :    Past Medical History:  Diagnosis Date  .  CHF (congestive heart failure) ([place])   . Diabetes mellitus without complication ([place])   . Hypertension   . Renal disorder       Past Surgical History:  Procedure Laterality Date  . AV FISTULA PLACEMENT    . CHOLECYSTECTOMY        Social History:     Social History  Substance Use Topics  . Smoking status: Never Smoker  . Smokeless tobacco: Never Used  . Alcohol use No     Lives - at home  Mobility - walks by self   Family History :     Family History  Problem Relation Age of Onset  . Hypertension Mother      Home Medications:   Prior to Admission medications   Not on File     Allergies:     Allergies  Allergen Reactions  . Latex Rash  . Penicillins Rash  Physical Exam:   Vitals  Blood pressure (!) 171/62, pulse 86, temperature 97.9 F (36.6 C), temperature source Oral, resp. rate 20, weight 106.5 kg (234 lb 14.4 oz), SpO2 95 %.   1. General lying in bed in NAD,    2. Normal affect and insight, Not Suicidal or Homicidal, Awake Alert, Oriented X 3.  3. No F.N deficits, ALL C.Nerves Intact, Strength 5/5 all 4 extremities, Sensation intact all 4 extremities, Plantars down going.  4. Ears and Eyes appear Normal, Conjunctivae clear, PERRLA. Moist Oral Mucosa.  5. Supple Neck, No JVD, No cervical lymphadenopathy appriciated, No Carotid Bruits.  6. Symmetrical Chest wall movement, Good air movement bilaterally, CTAB.  7. RRR, No Gallops, Rubs or Murmurs, No Parasternal Heave.  8. Positive Bowel Sounds, Abdomen Soft, Slight distention, and slight tenderness, No organomegaly appriciated,No rebound -guarding or rigidity.  9.  No Cyanosis, Normal Skin Turgor, No Skin Rash or Bruise.  10. Good muscle tone,  joints appear normal , no effusions, Normal ROM.  11. No Palpable Lymph Nodes in Neck or Axillae     Data Review:    CBC  Recent Labs Lab 09/22/17 1642  WBC 16.7*  HGB 15.3*  HCT 46.3*  PLT 256  MCV 90.6  MCH 29.9  MCHC 33.0  RDW  17.5*   ------------------------------------------------------------------------------------------------------------------  Chemistries   Recent Labs Lab 09/22/17 1642  NA 132*  K 4.6  CL 89*  CO2 22  GLUCOSE 264*  BUN 17  CREATININE 7.00*  CALCIUM 8.9  AST 27  ALT 23  ALKPHOS 200*  BILITOT 0.9   ------------------------------------------------------------------------------------------------------------------ CrCl cannot be calculated (Unknown ideal weight.). ------------------------------------------------------------------------------------------------------------------ No results for input(s): TSH, T4TOTAL, T3FREE, THYROIDAB in the last 72 hours.  Invalid input(s): FREET3  Coagulation profile No results for input(s): INR, PROTIME in the last 168 hours. ------------------------------------------------------------------------------------------------------------------- No results for input(s): DDIMER in the last 72 hours. -------------------------------------------------------------------------------------------------------------------  Cardiac Enzymes No results for input(s): CKMB, TROPONINI, MYOGLOBIN in the last 168 hours.  Invalid input(s): CK ------------------------------------------------------------------------------------------------------------------ No results found for: BNP   ---------------------------------------------------------------------------------------------------------------  Urinalysis No results found for: COLORURINE, APPEARANCEUR, LABSPEC, [place], GLUCOSEU, HGBUR, BILIRUBINUR, KETONESUR, PROTEINUR, UROBILINOGEN, NITRITE, LEUKOCYTESUR  ----------------------------------------------------------------------------------------------------------------   Imaging Results:    No results found.   Assessment & Plan:    Principal Problem:   SBO (small bowel obstruction) (HCC) Active Problems:   ESRD on dialysis ([place])   Hyponatremia   Type II  diabetes mellitus with complication (HCC)   Essential hypertension, benign    Abdominal pain secondary to SBO NPO NGT to low intermittent suction IR NGT request placed,  Please call IR in am to make sure on schedule Please call general surgery in am to ensure consulting. Appreciate input.   ESRD on HD (M, W, F) Please consult nephrology in am regarding need for routine HD  ? Ovarian neoplasm on CT scan Transvaginal ultrasound ordered  Dm2 fsbs q4h, ISS  Hyponatremia Gentle hydration w ns iv Check cmp in am  Hypertension Cont current medications.    DVT Prophylaxis Heparin -  SCDs  AM Labs Ordered, also please review Full Orders  Family Communication: Admission, patients condition and plan of care including tests being ordered have been discussed with the patient  who indicate understanding and agree with the plan and Code Status.  Code Status FULL CODE  Likely DC to  home  Condition GUARDED    Consults called:  Please call surgery in am and nephrology in am  Admission status: inpatient  Time spent in minutes : 45    Jani Gravel M.D on 09/23/2017 at 2:49 AM  Between 7am to 7pm - Pager - [phone]  . After 7pm go to [email or web address] - password [email or web address]  [facility] - Office  [phone]

## 2017-09-23 NOTE — Progress Notes (Signed)
Inpatient Diabetes Program Recommendations  AACE/ADA: New Consensus Statement on Inpatient Glycemic Control (2015)  Target Ranges:  Prepandial:   less than 140 mg/dL      Peak postprandial:   less than 180 mg/dL (1-2 hours)      Critically ill patients:  140 - 180 mg/dL   Results for NALA, KACHEL (MRN [id number]) as of 09/23/2017 08:07  Ref. Range 09/22/2017 16:28 09/23/2017 01:17 09/23/2017 04:48  Glucose-Capillary Latest Ref Range: 65 - 99 mg/dL 277 (H) 388 (H) 347 (H)   Review of Glycemic Control  Diabetes history: DM2 Outpatient Diabetes medications: None listed on home medication list; (per Care Everywhere tab note on 07/09/17, Tresiba 21 units QAM, Novolog TID per correction scale) Current orders for Inpatient glycemic control: Novolog 0-9 units TID with meals  Inpatient Diabetes Program Recommendations: Insulin - Basal: Please consider ordering Lantus 15 units Q24H starting now (based on 106 kg x 0.15 units). Correction (SSI): If patient will remain NPO, please consider changing frequency of CBGs and Novolog to Q4H. HgbA1C: Please order an A1C to evaluate glycemic control over the past 2-3 months.  Thanks, Barnie Alderman, RN, MSN, CDE Diabetes Coordinator Inpatient Diabetes Program [phone] (Team Pager from 8am to 5pm)

## 2017-09-23 NOTE — Progress Notes (Signed)
New Admission Note: Pt admitted to 2W09 from [facility] ED via stretcher  Arrival Method: transfer via carelink Mental Orientation: alert and oriented x 4 Telemetry: Placed per order Assessment: Completed Skin: Intact IV:  R AC Pain: 10/10 to abdomen Tubes: None Safety Measures: Safety Fall Prevention Plan has been discussed  Admission: to be completed [place] Orientation: Patient has been orientated to the room, unit and staff.  Family: None at bedside  Pt with active vomiting of dark brown blood tinged emesis x 1  Orders to be reviewed and implemented. Will continue to monitor the patient. Call light has been placed within reach and bed alarm has been activated.   Mady Gemma, BSN, RN-BC Phone: [phone]

## 2017-09-23 NOTE — Procedures (Signed)
I was present at this dialysis session. I have reviewed the session itself and made appropriate changes.   For HD given mild hyperkalemia, return tomorrow to keep on schedule.   Filed Weights   09/23/17 0124  Weight: 106.5 kg (234 lb 14.4 oz)     Recent Labs Lab 09/23/17 0436  NA 131*  K 5.9*  CL 85*  CO2 27  GLUCOSE 341*  BUN 31*  CREATININE 9.35*  CALCIUM 9.0     Recent Labs Lab 09/22/17 1642 09/23/17 0436  WBC 16.7* 15.5*  HGB 15.3* 14.5  HCT 46.3* 44.3  MCV 90.6 89.5  PLT 256 260    Scheduled Meds: . diatrizoate meglumine-sodium  90 mL Per NG tube Once  . heparin  5,000 Units Subcutaneous Q8H  . insulin aspart  0-9 Units Subcutaneous TID WC   Continuous Infusions: . sodium chloride Stopped (09/23/17 1513)   PRN Meds:.acetaminophen **OR** acetaminophen, HYDROmorphone (DILAUDID) injection, ondansetron (ZOFRAN) IV   Pearson Grippe  MD 09/23/2017, 3:26 PM

## 2017-09-23 NOTE — Consult Note (Signed)
[facility] Renal Consultation Note    Indication for Consultation:  Management of ESRD/hemodialysis, anemia, hypertension/volume, and secondary hyperparathyroidism. PCP:  HPI: Caitlin Stone is a 66 y.o. female with ESRD, Type 2 DM, HTN, and gout who was admitted with small bowel obstruction.   Pt reported abdominal pain with N/V and diarrhea x 2 days prior to admission. Presented to [facility] ED and underwent abdominal CT which showed SBO and possible R adnexal mass. She was transferred to [facility] and has since underwent IR placement of NG tube and abdominal ultrasound showing benign appearing R ovarian cyst and uterine fibroid. Nausea is improved with meds. Labs this morning show WBC 15.5, Hgb 14.5, K 5.9, Alb 3.4.  At this time, she denies fever, chills, abdominal pain, CP, or dyspnea.  From a renal standpoint, she dialyzes MWF at [facility]. Last dialysis was yesterday 10/8 which she completed. Has been using a LUE AVG without issues, she does report that she goes for shuntograms every 3 months, due for next one soon.  Past Medical History:  Diagnosis Date  . CHF (congestive heart failure) ([place])   . Diabetes mellitus without complication ([place])   . Hypertension   . Renal disorder    Past Surgical History:  Procedure Laterality Date  . AV FISTULA PLACEMENT    . CHOLECYSTECTOMY     Family History  Problem Relation Age of Onset  . Hypertension Mother   . Diabetes Mother    Social History:  reports that she has never smoked. She has never used smokeless tobacco. She reports that she does not drink alcohol or use drugs.  ROS: As per HPI otherwise negative.  Physical Exam: Vitals:   09/23/17 0124 09/23/17 0542 09/23/17 0900  BP: (!) 171/62 128/68 (!) 136/93  Pulse: 86 88 87  Resp: 20 18 18   Temp: 97.9 F (36.6 C) 98.1 F (36.7 C) 98.2 F (36.8 C)  TempSrc: Oral Oral Oral  SpO2: 95% 96% 100%  Weight: 106.5 kg (234 lb 14.4 oz)       General: Well  developed, well nourished, in no acute distress. NG tube in place. Head: Normocephalic, atraumatic, sclera non-icteric, mucus membranes are moist. Neck: Supple without lymphadenopathy/masses. JVD not elevated. Lungs: Clear bilaterally to auscultation without wheezes, rales, or rhonchi. Breathing is unlabored. Heart: RRR with normal S1, S2. No murmurs, rubs, or gallops appreciated. Abdomen: Soft, non-tender, non-distended. Hypoactive bowel sounds. Musculoskeletal:  Strength and tone appear normal for age. Lower extremities: 1+ pedal edema; trace LE edema.. Neuro: Alert and oriented X 3. Moves all extremities spontaneously. Psych:  Responds to questions appropriately with a normal affect. Dialysis Access: LUE AVG + bruit, but quiet. No palpable thrill.  Allergies  Allergen Reactions  . Latex Rash  . Penicillins Rash   Prior to Admission medications   Not on File   Current Facility-Administered Medications  Medication Dose Route Frequency Provider Last Rate Last Dose  . 0.9 %  sodium chloride infusion   Intravenous Continuous Jani Gravel, MD 50 mL/hr at 09/23/17 0510    . acetaminophen (TYLENOL) tablet 650 mg  650 mg Oral Q6H PRN Jani Gravel, MD       Or  . acetaminophen (TYLENOL) suppository 650 mg  650 mg Rectal Q6H PRN Jani Gravel, MD      . diatrizoate meglumine-sodium (GASTROGRAFIN) 66-10 % solution 90 mL  90 mL Per NG tube Once Meuth, Brooke A, PA-C      . heparin injection 5,000 Units  5,000  Units Subcutaneous Q8H Jani Gravel, MD   5,000 Units at 09/23/17 0504  . HYDROmorphone (DILAUDID) injection 1 mg  1 mg Intravenous Q4H PRN Jani Gravel, MD   1 mg at 09/23/17 0448  . insulin aspart (novoLOG) injection 0-9 Units  0-9 Units Subcutaneous TID WC Jani Gravel, MD   7 Units at 09/23/17 0830  . ondansetron (ZOFRAN) injection 4 mg  4 mg Intravenous Q6H PRN Jani Gravel, MD   4 mg at 09/23/17 0459   Labs: Basic Metabolic Panel:  Recent Labs Lab 09/22/17 1642 09/23/17 0436  NA 132* 131*   K 4.6 5.9*  CL 89* 85*  CO2 22 27  GLUCOSE 264* 341*  BUN 17 31*  CREATININE 7.00* 9.35*  CALCIUM 8.9 9.0   Liver Function Tests:  Recent Labs Lab 09/22/17 1642 09/23/17 0436  AST 27 23  ALT 23 23  ALKPHOS 200* 187*  BILITOT 0.9 0.8  PROT 8.8* 8.3*  ALBUMIN 3.7 3.4*    Recent Labs Lab 09/22/17 1642  LIPASE 18   CBC:  Recent Labs Lab 09/22/17 1642 09/23/17 0436  WBC 16.7* 15.5*  HGB 15.3* 14.5  HCT 46.3* 44.3  MCV 90.6 89.5  PLT 256 260   CBG:  Recent Labs Lab 09/22/17 1628 09/23/17 0117 09/23/17 0448 09/23/17 0822  GLUCAP 277* 388* 347* 319*   Studies/Results: Dg Abd 1 View  Result Date: 09/23/2017 CLINICAL DATA:  NG tube placement . EXAM: ABDOMEN - 1 VIEW COMPARISON:  No recent prior . FINDINGS: NG tube noted with tip projected over the stomach. EKG leads noted over the chest. Surgical clips right upper quadrant. IMPRESSION: NG tube noted with tip projected over the stomach. Electronically Signed   By: Marcello Moores  Register   On: 09/23/2017 09:58   US Pelvic Complete With Transvaginal  Result Date: 09/23/2017 CLINICAL DATA:  Cystic right ovarian lesion on outside CT. Remote history of ruptured ectopic, with the patient being unsure if the left ovary and fallopian tube were removed at that time. EXAM: TRANSABDOMINAL AND TRANSVAGINAL ULTRASOUND OF PELVIS TECHNIQUE: Both transabdominal and transvaginal ultrasound examinations of the pelvis were performed. Transabdominal technique was performed for global imaging of the pelvis including uterus, ovaries, adnexal regions, and pelvic cul-de-sac. It was necessary to proceed with endovaginal exam following the transabdominal exam to visualize the ovaries. COMPARISON:  None FINDINGS: Examination is limited by patient body habitus. Uterus Measurements: 8.4 x 4.2 x 6.0 cm. 2.2 x 1.4 x 2.2 cm intramural mass in the left aspect of the uterine fundus with a few echogenic foci suggesting calcification, most compatible with a  fibroid. Endometrium Thickness: 8 mm, upper limits of normal in a postmenopausal patient. No focal abnormality visualized. Right ovary Measurements: 2.7 x 4.3 x 5.2 cm. Simple appearing 1.9 x 1.5 x 2.1 cm cyst. Left ovary Not visualized. Other findings No abnormal free fluid. IMPRESSION: 1. 2.1 cm right ovarian cyst. This is almost certainly benign, but follow up ultrasound is recommended in 1 year according to the Society of Radiologists in [place] Statement (D Clovis Riley et al. Management of Asymptomatic Ovarian and Other Adnexal Cysts Imaged at [place]: Society of Radiologists in [place] Statement 2010. Radiology 256 (Sept 2010): 943-954.). 2. Nonvisualization of the left ovary. 3. 2.2 cm uterine fibroid. Electronically Signed   By: Logan Bores M.D.   On: 09/23/2017 10:45    Dialysis Orders:  MWF at [facility] 3:45hr, BFR 400, DFR 800, EDW 107.5kg, 2K/2.25Ca bath, UF profile #2,  AVG, Heparin 5000 bolus - Hectoral 33mcg IV q HD - No IV iron or ESA at this time (on hold for high Hgb)  Assessment/Plan: 1.  SBO: NG tube in place. Per primary and surgery. 2.  Hyperkalemia: K 5.9 today. With SBO, unable to give kayexalate. Will plan short run HD today to bring K down. 3.  ESRD: As above for HD today, then back to usual MWF schedule. No heparin for now. 4.  Hypertension/volume: BP controlled. Below her outpt EDW at this time, mild pedal edema present. 5.  Anemia: Hgb 14.5. No ESA needed for now. 6.  Metabolic bone disease: Ca ok, Phos pending. Takes Renvela as binder as outpt, will plan to change to alternative binder once eating as Renvela is contraindicated in SBO. 7.  Nutrition: Alb low, NPO for now. Follow. 8.  AVG (dialysis access): Quiet bruit, last access flow test was low. Will try to use as normal for now, but will need shuntogram in the near future (likely as outpatient).  Veneta Penton, PA-C 09/23/2017, 11:40 AM  [employer] Pager: [phone]

## 2017-09-23 NOTE — Consult Note (Signed)
[facility] Surgery Consult Note  Caitlin Stone 09-Nov-1951  [id number].    Requesting MD: Cordelia Poche Chief Complaint/Reason for Consult: SBO  HPI:  Caitlin Stone is a 66yo female PMH ESRD, who presented to [facility] last night with 2 days of crampy abdominal pain. States that the pain gradually got worse. It became constant and severe, associated with nausea and vomiting. Unable to tolerate anything by mouth. States that she had diarrhea yesterday, and this was her last BM. CT scan showed SBO with transition point in the lower mid to right anterior pelvis as well as a cystic lesion which could represent neoplasm over the right ovary (this is per report, I am unable to view images). Patient was transferred to [facility] and general surgery asked to consult. Unable to place NG tune in ED therefore patient waiting to go to IR for placement. She has vomited since admission. Not passing any flatus today. Currently dilaudid is helping the pain.   Patient states that she has had 1 prior SBO several years ago in [place]. Per patient she did not have surgery for this, but there is report in the chart that she had a lysis of adhesions.  PMH significant for ESRD on dialysis MWF, HTN, DM, CHF Abdominal surgical history: lap chole 12/2016, c section, ?lysis of adhesions Anticoagulants: none (was on Eliquis prior to 12/2016 for what sounds like a possible UE DVT) Nonsmoker Lives home alone, uses walker for ambulation  ROS: Review of Systems  Constitutional: Negative.   HENT: Negative.   Eyes: Negative.   Respiratory: Negative.   Cardiovascular: Negative.   Gastrointestinal: Positive for abdominal pain, constipation, diarrhea, nausea and vomiting.  Genitourinary: Negative.   Musculoskeletal: Negative.   Skin: Negative.   Neurological: Negative.    All systems reviewed and otherwise negative except for as above  Family History  Problem Relation Age of Onset  . Hypertension  Mother   . Diabetes Mother     Past Medical History:  Diagnosis Date  . CHF (congestive heart failure) ([place])   . Diabetes mellitus without complication ([place])   . Hypertension   . Renal disorder     Past Surgical History:  Procedure Laterality Date  . AV FISTULA PLACEMENT    . CHOLECYSTECTOMY      Social History:  reports that she has never smoked. She has never used smokeless tobacco. She reports that she does not drink alcohol or use drugs.  Allergies:  Allergies  Allergen Reactions  . Latex Rash  . Penicillins Rash    No prescriptions prior to admission.    Prior to Admission medications   Not on File    Blood pressure 128/68, pulse 88, temperature 98.1 F (36.7 C), temperature source Oral, resp. rate 18, weight 234 lb 14.4 oz (106.5 kg), SpO2 96 %. Physical Exam: General: pleasant, WD/WN AA female who is laying in bed in NAD HEENT: head is normocephalic, atraumatic.  Sclera are noninjected.  Pupils equal and round.  Ears and nose without any masses or lesions.  Mouth is pink and moist. Edentate  Heart: regular, rate, and rhythm.  No obvious murmurs, gallops, or rubs noted.  Palpable pedal pulses bilaterally Lungs: CTAB, no wheezes, rhonchi, or rales noted.  Respiratory effort nonlabored Abd: well healed midline and lap incisions, soft, distended, +BS, no masses, hernias, or organomegaly. Nontender. MS: all 4 extremities are symmetrical with no gross deformity. Mild BLE edema Skin: warm and dry with no masses, lesions, or rashes Psych:  A&Ox3 with an appropriate affect. Neuro: cranial nerves grossly intact, extremity CSM intact bilaterally, normal speech  Results for orders placed or performed during the hospital encounter of 09/23/17 (from the past 48 hour(s))  Glucose, capillary     Status: Abnormal   Collection Time: 09/23/17  1:17 AM  Result Value Ref Range   Glucose-Capillary 388 (H) 65 - 99 mg/dL  MRSA PCR Screening     Status: None   Collection Time:  09/23/17  1:19 AM  Result Value Ref Range   MRSA by PCR NEGATIVE NEGATIVE    Comment:        The GeneXpert MRSA Assay (FDA approved for NASAL specimens only), is one component of a comprehensive MRSA colonization surveillance program. It is not intended to diagnose MRSA infection nor to guide or monitor treatment for MRSA infections.   Comprehensive metabolic panel     Status: Abnormal   Collection Time: 09/23/17  4:36 AM  Result Value Ref Range   Sodium 131 (L) 135 - 145 mmol/L   Potassium 5.9 (H) 3.5 - 5.1 mmol/L    Comment: DELTA CHECK NOTED   Chloride 85 (L) 101 - 111 mmol/L   CO2 27 22 - 32 mmol/L   Glucose, Bld 341 (H) 65 - 99 mg/dL   BUN 31 (H) 6 - 20 mg/dL   Creatinine, Ser 9.35 (H) 0.44 - 1.00 mg/dL   Calcium 9.0 8.9 - 10.3 mg/dL   Total Protein 8.3 (H) 6.5 - 8.1 g/dL   Albumin 3.4 (L) 3.5 - 5.0 g/dL   AST 23 15 - 41 U/L   ALT 23 14 - 54 U/L   Alkaline Phosphatase 187 (H) 38 - 126 U/L   Total Bilirubin 0.8 0.3 - 1.2 mg/dL   GFR calc non Af Amer 4 (L) >60 mL/min   GFR calc Af Amer 5 (L) >60 mL/min    Comment: (NOTE) The eGFR has been calculated using the CKD EPI equation. This calculation has not been validated in all clinical situations. eGFR's persistently <60 mL/min signify possible Chronic Kidney Disease.    Anion gap 19 (H) 5 - 15  CBC     Status: Abnormal   Collection Time: 09/23/17  4:36 AM  Result Value Ref Range   WBC 15.5 (H) 4.0 - 10.5 K/uL   RBC 4.95 3.87 - 5.11 MIL/uL   Hemoglobin 14.5 12.0 - 15.0 g/dL   HCT 44.3 36.0 - 46.0 %   MCV 89.5 78.0 - 100.0 fL   MCH 29.3 26.0 - 34.0 pg   MCHC 32.7 30.0 - 36.0 g/dL   RDW 17.6 (H) 11.5 - 15.5 %   Platelets 260 150 - 400 K/uL  Glucose, capillary     Status: Abnormal   Collection Time: 09/23/17  4:48 AM  Result Value Ref Range   Glucose-Capillary 347 (H) 65 - 99 mg/dL   No results found.  Anti-infectives    None       Assessment/Plan ESRD on dialysis MWF HTN DM CHF  ? Ovarian neoplasm  on CT scan - transvaginal u/s pending ? 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 Patient waiting to go to IR for NG tube placement. After a period of time with NG to LIWS will plan to start patient on small bowel protocol. Will continue to follow.  Wellington Hampshire, [facility] Surgery 09/23/2017, 8:07 AM Pager: [phone] Consults: [phone] Mon-Fri 7:00 am-4:30 pm Sat-Sun 7:00 am-11:30 am

## 2017-09-24 ENCOUNTER — Inpatient Hospital Stay (HOSPITAL_COMMUNITY): Payer: Medicare Other

## 2017-09-24 DIAGNOSIS — E118 Type 2 diabetes mellitus with unspecified complications: Secondary | ICD-10-CM

## 2017-09-24 DIAGNOSIS — E875 Hyperkalemia: Secondary | ICD-10-CM

## 2017-09-24 LAB — GLUCOSE, CAPILLARY
Glucose-Capillary: 226 mg/dL — ABNORMAL HIGH (ref 65–99)
Glucose-Capillary: 204 mg/dL — ABNORMAL HIGH (ref 65–99)
Glucose-Capillary: 267 mg/dL — ABNORMAL HIGH (ref 65–99)
Glucose-Capillary: 368 mg/dL — ABNORMAL HIGH (ref 65–99)
Glucose-Capillary: 231 mg/dL — ABNORMAL HIGH (ref 65–99)

## 2017-09-24 LAB — CBC WITH DIFFERENTIAL/PLATELET
Basophils Absolute: 0 10*3/uL (ref 0.0–0.1)
Basophils Relative: 0 %
Eosinophils Absolute: 0.4 10*3/uL (ref 0.0–0.7)
Eosinophils Relative: 3 %
HCT: 42.4 % (ref 36.0–46.0)
Hemoglobin: 13.6 g/dL (ref 12.0–15.0)
Lymphocytes Relative: 23 %
Lymphs Abs: 2.8 10*3/uL (ref 0.7–4.0)
MCH: 29.3 pg (ref 26.0–34.0)
MCHC: 32.1 g/dL (ref 30.0–36.0)
MCV: 91.4 fL (ref 78.0–100.0)
Monocytes Absolute: 1.2 10*3/uL — ABNORMAL HIGH (ref 0.1–1.0)
Monocytes Relative: 10 %
Neutro Abs: 7.7 10*3/uL (ref 1.7–7.7)
Neutrophils Relative %: 64 %
Platelets: 220 10*3/uL (ref 150–400)
RBC: 4.64 MIL/uL (ref 3.87–5.11)
RDW: 17.9 % — ABNORMAL HIGH (ref 11.5–15.5)
WBC: 12 10*3/uL — ABNORMAL HIGH (ref 4.0–10.5)

## 2017-09-24 LAB — COMPREHENSIVE METABOLIC PANEL
ALT: 19 U/L (ref 14–54)
AST: 17 U/L (ref 15–41)
Albumin: 3.1 g/dL — ABNORMAL LOW (ref 3.5–5.0)
Alkaline Phosphatase: 147 U/L — ABNORMAL HIGH (ref 38–126)
Anion gap: 19 — ABNORMAL HIGH (ref 5–15)
BUN: 21 mg/dL — ABNORMAL HIGH (ref 6–20)
CO2: 23 mmol/L (ref 22–32)
Calcium: 8.7 mg/dL — ABNORMAL LOW (ref 8.9–10.3)
Chloride: 91 mmol/L — ABNORMAL LOW (ref 101–111)
Creatinine, Ser: 7.42 mg/dL — ABNORMAL HIGH (ref 0.44–1.00)
GFR calc Af Amer: 6 mL/min — ABNORMAL LOW (ref >60)
GFR calc non Af Amer: 5 mL/min — ABNORMAL LOW (ref >60)
Glucose, Bld: 221 mg/dL — ABNORMAL HIGH (ref 65–99)
Potassium: 4.5 mmol/L (ref 3.5–5.1)
Sodium: 133 mmol/L — ABNORMAL LOW (ref 135–145)
Total Bilirubin: 1.2 mg/dL (ref 0.3–1.2)
Total Protein: 7.4 g/dL (ref 6.5–8.1)

## 2017-09-24 LAB — MAGNESIUM: Magnesium: 2.2 mg/dL (ref 1.7–2.4)

## 2017-09-24 LAB — PHOSPHORUS: Phosphorus: 6.9 mg/dL — ABNORMAL HIGH (ref 2.5–4.6)

## 2017-09-24 MED ORDER — INSULIN ASPART 100 UNIT/ML ~~LOC~~ SOLN
0.0000 [IU] | Freq: Every day | SUBCUTANEOUS | Status: DC
  Administered 2017-09-24: 2 [IU] via SUBCUTANEOUS
  Administered 2017-09-25: 3 [IU] via SUBCUTANEOUS

## 2017-09-24 MED ORDER — INSULIN GLARGINE 100 UNIT/ML ~~LOC~~ SOLN
10.0000 [IU] | Freq: Every day | SUBCUTANEOUS | Status: DC
  Administered 2017-09-24 – 2017-09-26 (×3): 10 [IU] via SUBCUTANEOUS
  Filled 2017-09-24 (×4): qty 0.1

## 2017-09-24 MED ORDER — PHENOL 1.4 % MT LIQD: 1.0000 | OROMUCOSAL | Status: DC | PRN

## 2017-09-24 MED FILL — Fentanyl Citrate Preservative Free (PF) Inj 100 MCG/2ML: INTRAMUSCULAR | Qty: 2 | Status: AC

## 2017-09-24 NOTE — Progress Notes (Signed)
Subjective/Chief Complaint: Pt doing well had BMx 1 yesterday. KUB today shows contrast within the right colon.   Objective: Vital signs in last 24 hours: Temp:  [97.6 F (36.4 C)-98.7 F (37.1 C)] 98.7 F (37.1 C) (10/10 0436) Pulse Rate:  [58-98] 87 (10/10 0436) Resp:  [18-20] 20 (10/10 0436) BP: (95-140)/(28-97) 133/46 (10/10 0436) SpO2:  [90 %-100 %] 90 % (10/10 0436) Weight:  [106.3 kg (234 lb 5.6 oz)-107 kg (235 lb 14.3 oz)] 106.8 kg (235 lb 7.2 oz) (10/09 2003) Last BM Date: 09/23/17  Intake/Output from previous day: 10/09 0701 - 10/10 0700 In: 0  Out: 1150 [Emesis/NG output:650] Intake/Output this shift: No intake/output data recorded.  Constitutional: No acute distress, conversant, appears states age. Eyes: Anicteric sclerae, moist conjunctiva, no lid lag Lungs: Clear to auscultation bilaterally, normal respiratory effort CV: regular rate and rhythm, no murmurs, no peripheral edema, pedal pulses 2+ GI: Soft, no masses or hepatosplenomegaly, non-tender to palpation, normal BS, ND Skin: No rashes, palpation reveals normal turgor Psychiatric: appropriate judgment and insight, oriented to person, place, and time   Lab Results:   Recent Labs  09/23/17 0436 09/24/17 0850  WBC 15.5* 12.0*  HGB 14.5 13.6  HCT 44.3 42.4  PLT 260 220   BMET  Recent Labs  09/22/17 1642 09/23/17 0436  NA 132* 131*  K 4.6 5.9*  CL 89* 85*  CO2 22 27  GLUCOSE 264* 341*  BUN 17 31*  CREATININE 7.00* 9.35*  CALCIUM 8.9 9.0   PT/INR No results for input(s): LABPROT, INR in the last 72 hours. ABG No results for input(s): PHART, HCO3 in the last 72 hours.  Invalid input(s): PCO2, PO2  Studies/Results: Dg Abd 1 View  Result Date: 09/23/2017 CLINICAL DATA:  NG tube placement . EXAM: ABDOMEN - 1 VIEW COMPARISON:  No recent prior . FINDINGS: NG tube noted with tip projected over the stomach. EKG leads noted over the chest. Surgical clips right upper quadrant. IMPRESSION:  NG tube noted with tip projected over the stomach. Electronically Signed   By: Marcello Moores  Register   On: 09/23/2017 09:58   Dg Abd Portable 1v-small Bowel Obstruction Protocol-initial, 8 Hr Delay  Result Date: 09/24/2017 CLINICAL DATA:  Small bowel obstruction.  8 hour delay. EXAM: PORTABLE ABDOMEN - 1 VIEW COMPARISON:  Fluoroscopy from yesterday FINDINGS: There is a nasogastric tube with tip at the pylorus. Administered oral contrast opacifies the colon. No dilated bowel. Cholecystectomy clips. IMPRESSION: 1. Oral contrast reached nondilated colon. No dilated bowel to suggest obstruction. 2. Nasogastric tube with tip at the pylorus. Electronically Signed   By: Monte Fantasia M.D.   On: 09/24/2017 08:09   US Pelvic Complete With Transvaginal  Result Date: 09/23/2017 CLINICAL DATA:  Cystic right ovarian lesion on outside CT. Remote history of ruptured ectopic, with the patient being unsure if the left ovary and fallopian tube were removed at that time. EXAM: TRANSABDOMINAL AND TRANSVAGINAL ULTRASOUND OF PELVIS TECHNIQUE: Both transabdominal and transvaginal ultrasound examinations of the pelvis were performed. Transabdominal technique was performed for global imaging of the pelvis including uterus, ovaries, adnexal regions, and pelvic cul-de-sac. It was necessary to proceed with endovaginal exam following the transabdominal exam to visualize the ovaries. COMPARISON:  None FINDINGS: Examination is limited by patient body habitus. Uterus Measurements: 8.4 x 4.2 x 6.0 cm. 2.2 x 1.4 x 2.2 cm intramural mass in the left aspect of the uterine fundus with a few echogenic foci suggesting calcification, most compatible with a fibroid. Endometrium  Thickness: 8 mm, upper limits of normal in a postmenopausal patient. No focal abnormality visualized. Right ovary Measurements: 2.7 x 4.3 x 5.2 cm. Simple appearing 1.9 x 1.5 x 2.1 cm cyst. Left ovary Not visualized. Other findings No abnormal free fluid. IMPRESSION: 1. 2.1 cm  right ovarian cyst. This is almost certainly benign, but follow up ultrasound is recommended in 1 year according to the Society of Radiologists in [place] Statement (D Clovis Riley et al. Management of Asymptomatic Ovarian and Other Adnexal Cysts Imaged at [place]: Society of Radiologists in [place] Statement 2010. Radiology 256 (Sept 2010): 943-954.). 2. Nonvisualization of the left ovary. 3. 2.2 cm uterine fibroid. Electronically Signed   By: Logan Bores M.D.   On: 09/23/2017 10:45    Anti-infectives: Anti-infectives    None      Assessment/Plan: ESRD on dialysis MWF HTN DM CHF  ? Ovarian neoplasm on CT scan - transvaginal u/s pending ? 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  If tol clears OK to ADAT  LOS: 1 day    Rosario Jacks., Anne Hahn 09/24/2017

## 2017-09-24 NOTE — Progress Notes (Signed)
[facility] Progress Note   Subjective:    Feeling better. Hungry.  Objective Vitals:   09/23/17 1858 09/23/17 2003 09/24/17 0436 09/24/17 0900  BP: 138/90 (!) 125/97 (!) 133/46 91/70  Pulse: 90 87 87 88  Resp: 18 19 20 18   Temp: 97.8 F (36.6 C) 97.6 F (36.4 C) 98.7 F (37.1 C) 98.4 F (36.9 C)  TempSrc: Oral Oral Oral Oral  SpO2: 100% 100% 90% 91%  Weight: 106.3 kg (234 lb 5.6 oz) 106.8 kg (235 lb 7.2 oz)     Physical Exam General:NAD, WDWN, obese Heart:RRR Lungs:CTA b/l Abdomen:soft, NT Extremities:2+ edema b/l Dialysis Access: AVG +bruit   Filed Weights   09/23/17 1530 09/23/17 1858 09/23/17 2003  Weight: 107 kg (235 lb 14.3 oz) 106.3 kg (234 lb 5.6 oz) 106.8 kg (235 lb 7.2 oz)    Intake/Output Summary (Last 24 hours) at 09/24/17 1231 Last data filed at 09/24/17 0900  Gross per 24 hour  Intake                0 ml  Output             1150 ml  Net            -1150 ml    Additional Objective Labs: Basic Metabolic Panel:  Recent Labs Lab 09/22/17 1642 09/23/17 0436 09/24/17 0850  NA 132* 131* 133*  K 4.6 5.9* 4.5  CL 89* 85* 91*  CO2 22 27 23   GLUCOSE 264* 341* 221*  BUN 17 31* 21*  CREATININE 7.00* 9.35* 7.42*  CALCIUM 8.9 9.0 8.7*  PHOS  --   --  6.9*   Liver Function Tests:  Recent Labs Lab 09/22/17 1642 09/23/17 0436 09/24/17 0850  AST 27 23 17   ALT 23 23 19   ALKPHOS 200* 187* 147*  BILITOT 0.9 0.8 1.2  PROT 8.8* 8.3* 7.4  ALBUMIN 3.7 3.4* 3.1*    Recent Labs Lab 09/22/17 1642  LIPASE 18   CBC:  Recent Labs Lab 09/22/17 1642 09/23/17 0436 09/24/17 0850  WBC 16.7* 15.5* 12.0*  NEUTROABS  --   --  7.7  HGB 15.3* 14.5 13.6  HCT 46.3* 44.3 42.4  MCV 90.6 89.5 91.4  PLT 256 260 220   CBG:  Recent Labs Lab 09/23/17 1136 09/23/17 2002 09/24/17 0435 09/24/17 0807 09/24/17 1204  GLUCAP 182* 136* 226* 204* 267*   Studies/Results: Dg Abd 1 View  Result Date: 09/23/2017 CLINICAL DATA:  NG tube  placement . EXAM: ABDOMEN - 1 VIEW COMPARISON:  No recent prior . FINDINGS: NG tube noted with tip projected over the stomach. EKG leads noted over the chest. Surgical clips right upper quadrant. IMPRESSION: NG tube noted with tip projected over the stomach. Electronically Signed   By: Marcello Moores  Register   On: 09/23/2017 09:58   Dg Abd Portable 1v-small Bowel Obstruction Protocol-initial, 8 Hr Delay  Result Date: 09/24/2017 CLINICAL DATA:  Small bowel obstruction.  8 hour delay. EXAM: PORTABLE ABDOMEN - 1 VIEW COMPARISON:  Fluoroscopy from yesterday FINDINGS: There is a nasogastric tube with tip at the pylorus. Administered oral contrast opacifies the colon. No dilated bowel. Cholecystectomy clips. IMPRESSION: 1. Oral contrast reached nondilated colon. No dilated bowel to suggest obstruction. 2. Nasogastric tube with tip at the pylorus. Electronically Signed   By: Monte Fantasia M.D.   On: 09/24/2017 08:09   US Pelvic Complete With Transvaginal  Result Date: 09/23/2017 CLINICAL DATA:  Cystic right ovarian lesion on outside CT.  Remote history of ruptured ectopic, with the patient being unsure if the left ovary and fallopian tube were removed at that time. EXAM: TRANSABDOMINAL AND TRANSVAGINAL ULTRASOUND OF PELVIS TECHNIQUE: Both transabdominal and transvaginal ultrasound examinations of the pelvis were performed. Transabdominal technique was performed for global imaging of the pelvis including uterus, ovaries, adnexal regions, and pelvic cul-de-sac. It was necessary to proceed with endovaginal exam following the transabdominal exam to visualize the ovaries. COMPARISON:  None FINDINGS: Examination is limited by patient body habitus. Uterus Measurements: 8.4 x 4.2 x 6.0 cm. 2.2 x 1.4 x 2.2 cm intramural mass in the left aspect of the uterine fundus with a few echogenic foci suggesting calcification, most compatible with a fibroid. Endometrium Thickness: 8 mm, upper limits of normal in a postmenopausal patient.  No focal abnormality visualized. Right ovary Measurements: 2.7 x 4.3 x 5.2 cm. Simple appearing 1.9 x 1.5 x 2.1 cm cyst. Left ovary Not visualized. Other findings No abnormal free fluid. IMPRESSION: 1. 2.1 cm right ovarian cyst. This is almost certainly benign, but follow up ultrasound is recommended in 1 year according to the Society of Radiologists in [place] Statement (D Clovis Riley et al. Management of Asymptomatic Ovarian and Other Adnexal Cysts Imaged at [place]: Society of Radiologists in [place] Statement 2010. Radiology 256 (Sept 2010): 943-954.). 2. Nonvisualization of the left ovary. 3. 2.2 cm uterine fibroid. Electronically Signed   By: Logan Bores M.D.   On: 09/23/2017 10:45    Medications:  . heparin  5,000 Units Subcutaneous Q8H  . insulin aspart  0-9 Units Subcutaneous TID WC    Dialysis Orders: MWF at [facility] 3:45hr, BFR 400, DFR 800, EDW 107.5kg, 2K/2.25Ca bath, UF profile #2, AVG, Heparin 5000 bolus - Hectoral 15mcg IV q HD - No IV iron or ESA at this time (on hold for high Hgb)  Assessment/Plan: 1. SBO - NGT to be removed per surgery, diet advaced. KUB shows contrast in right colon. Per primary and surgery.   2. Hyperkalemia - resovled. K 4.5 follow 3.ESRD - K improved. Extra HD yesterday, tolerated well, net UF0.5L post weight 106.3kg.  To resume regular schedule today.  4. Anemia - Hgb 13.6. Continue to hold ESA and iron. 5. Secondary hyperparathyroidism - Cor Ca within goal. P elevated, continue binders and VDRA.  6. HTN/volume - BP variable. Under EDW, titrate down volume as tolerated.  7. Nutrition - Alb 3.1. Renal diet once advanced. Per surgery.  8. AVG - Soft bruit, will need repeat AF and possible fistulogram as outpatient.    Jen Mow, PA-C [facility] Pager: [phone] 09/24/2017,12:31 PM  LOS: 1 day

## 2017-09-24 NOTE — Progress Notes (Signed)
PROGRESS NOTE    Caitlin Stone  MWU:[id number] DOB: 1951/03/12 DOA: 09/23/2017 PCP: Fleet Contras, MD   Brief Narrative:  Caitlin Stone  is a 66 y.o. female, w Hypertension, DM2, ESRD on HD (M, W, F), prior Cholecystectomy, lysis of adhesions, who presented w c/o abdominal pain and n/v for the past 2 days.  Pt states that abdominal pain generalized. Denies fever, chills, diarrhea, brbpr, black stool.  Pt presented to ED ,but wait was too long so went to [facility].  CT scant there shhowed SBO with transition in the lower mid to right anterior pelvis as well as a cystic lesion could reporesent neoplasm over the right ovary.  Wbc 14, Hgb 15, Bun/creat 24/7.7, apparently Dr. Donne Hazel notified and recommended general surgery consult at [facility] and NGT to low intermittent suction.  Pt could not tolerate NGT placement so was placed by IR. Patient is improving and NGT to be removed and patient to be started on a Clear Liquid Diet by General Surgery. Nephrology following for Maintenance of HD.   Assessment & Plan:   Principal Problem:   SBO (small bowel obstruction) (HCC) Active Problems:   ESRD on dialysis ([place])   Hyponatremia   Type II diabetes mellitus with complication (HCC)   Essential hypertension, benign   Ovarian mass   Hyperkalemia  Abdominal Pain 2/2 to Acute SBO -CT Scan done at [facility] ED; Showed findings consistent with a SBO. Transition point appeared to be within the Mid to Right Anterior Pelvis  -NPO changed to Clear Liquid Diet by General Surgery -NGT placed by IR; NGT to low intermittent suction to be D/C'd by General Surgery -Abdominal Flat Plate showed oral contrast reached non-dilated colon. No dilated bowel to suggest obstruction. 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 Differential includes multiple cysts, multicystic mass and hydrosalpinx. Recommend further evaluation with pelvic ultrasound. -Transvaginal ultrasound ordered and showed 2.1 cm Right Ovarian Cyst, Non-visualization of the Left Ovary, and a 2.2 cm Uterine Fibroid. -Recommend 1 year follow up U/S  Diabetes Mellitus Type 2 -Check HbA1c in AM -Added Lantus 10 units and C/w Sensitive Novolog SSI AC and add HS per Diabetes Education Coordinator  -CBG's ranging from 136-267 -Patient on a Clear Liquid Diet now   Hyperkalemia -Improved with Dialysis -K+ went from 5.9 -> 4.5 -Continue to Monitor and Repeat CMP in AM   Hyponatremia -Mild at 133; Improved from 131 -Gentle Hydration with IV NS discontinued -Repeat CMP in AM   Hypertension -BP has been on the lower Sid -Home Meds of Carvedilol 6.25 mg po Daily and Valsartan 160 mg po Daily currently being held -Will add IV Hydralazine as Necessary   Hx of CHF (unknown whether Diastolic or Systolic) -Holding Carvedilol and Valsartan currently -Gets Dialyzed MWF for Volume Maintenance  -Continue to Monitor   DVT prophylaxis: Heparin 5,000 units sq q8h Code Status: FULL CODE Family Communication:  Disposition Plan:   Consultants:  Nephrology  General Surgery  Interventional Radiology    Procedures:  NGT placed by IR   Antimicrobials:  Anti-infectives    None     Subjective: Seen and examined at bedside and stated Abdominal Pain improved and she was hungry. No N/V. Had about 650 mL put out from NG. No CP or SOB. Wanting to eat.   Objective: Vitals:     09/23/17 1858 09/23/17 2003 09/24/17 0436 09/24/17 0900  BP: 138/90 (!) 125/97 (!) 133/46 91/70  Pulse: 90 87 87 88  Resp: 18 19 20 18   Temp: 97.8 F (36.6 C) 97.6 F (36.4 C) 98.7 F (37.1 C) 98.4 F (36.9 C)  TempSrc: Oral Oral Oral Oral  SpO2: 100% 100% 90% 91%  Weight: 106.3 kg (234 lb 5.6 oz) 106.8 kg (235 lb 7.2 oz)      Intake/Output Summary (Last 24 hours) at 09/24/17 1315 Last data filed at 09/24/17 0900  Gross per 24 hour  Intake                0 ml  Output             1150 ml  Net            -1150 ml   Filed Weights   09/23/17 1530 09/23/17 1858 09/23/17 2003  Weight: 107 kg (235 lb 14.3 oz) 106.3 kg (234 lb 5.6 oz) 106.8 kg (235 lb 7.2 oz)   Examination: Physical Exam:  Constitutional: WN/WD obese AAF in NAD and appears calm and comfortable Eyes: Lds and conjunctivae normal, sclerae anicteric  ENMT: External Ears, Nose appear normal. Grossly normal hearing. Mucous membranes are moist. NGT in Right Nare hooked to LIS.  Neck: Appears normal, supple, no cervical masses, normal ROM, no appreciable thyromegaly, no appreciable JVD Respiratory: Diminished to auscultation bilaterally, no wheezing, rales, rhonchi or crackles. Normal respiratory effort and patient is not tachypenic. No accessory muscle use.  Cardiovascular: RRR, no murmurs / rubs / gallops. S1 and S2 auscultated. No extremity edema.  Abdomen: Soft, non-tender, Distended due to body habitus. No masses palpated. No appreciable hepatosplenomegaly. Bowel sounds positive.  GU: Deferred. Musculoskeletal: No clubbing / cyanosis of digits/nails. No joint deformity upper and lower extremities. Good ROM, no contractures.  Skin: No rashes, lesions, ulcers on a limited skin eval. No induration; Warm and dry.  Neurologic: CN 2-12 grossly intact with no focal deficits. Romberg sign cerebellar reflexes not assessed.  Psychiatric: Normal judgment and insight. Alert and oriented x 3. Normal mood and appropriate affect.    Data Reviewed: I have personally reviewed following labs and imaging studies  CBC:  Recent Labs Lab 09/22/17 1642 09/23/17 0436 09/24/17 0850  WBC 16.7* 15.5* 12.0*  NEUTROABS  --   --  7.7  HGB 15.3* 14.5 13.6  HCT 46.3* 44.3 42.4  MCV 90.6 89.5 91.4  PLT 256 260 865   Basic Metabolic Panel:  Recent Labs Lab 09/22/17 1642 09/23/17 0436 09/24/17 0850  NA 132* 131* 133*  K 4.6 5.9* 4.5  CL 89* 85* 91*  CO2 22 27 23   GLUCOSE 264* 341* 221*  BUN 17 31* 21*  CREATININE 7.00* 9.35* 7.42*  CALCIUM 8.9 9.0 8.7*  MG  --   --  2.2  PHOS  --   --  6.9*   GFR: CrCl cannot be calculated (Unknown ideal weight.). Liver Function Tests:  Recent Labs Lab 09/22/17 1642 09/23/17 0436 09/24/17 0850  AST 27 23 17   ALT 23 23 19  ALKPHOS 200* 187* 147*  BILITOT 0.9 0.8 1.2  PROT 8.8* 8.3* 7.4  ALBUMIN 3.7 3.4* 3.1*    Recent Labs Lab 09/22/17 1642  LIPASE 18   No results for input(s): AMMONIA in the last 168 hours. Coagulation Profile: No results for input(s): INR, PROTIME in the last 168 hours. Cardiac Enzymes: No results for input(s): CKTOTAL, CKMB, CKMBINDEX, TROPONINI in the last 168 hours. BNP (last 3 results) No results for input(s): PROBNP in the last 8760 hours. HbA1C: No results for input(s): HGBA1C in the last 72 hours. CBG:  Recent Labs Lab 09/23/17 1136 09/23/17 2002 09/24/17 0435 09/24/17 0807 09/24/17 1204  GLUCAP 182* 136* 226* 204* 267*   Lipid Profile: No results for input(s): CHOL, HDL, LDLCALC, TRIG, CHOLHDL, LDLDIRECT in the last 72 hours. Thyroid Function Tests: No results for input(s): TSH, T4TOTAL, FREET4, T3FREE, THYROIDAB in the last 72 hours. Anemia Panel: No results for input(s): VITAMINB12, FOLATE, FERRITIN, TIBC, IRON, RETICCTPCT in the last 72 hours. Sepsis Labs: No results for input(s): PROCALCITON, LATICACIDVEN in the last 168 hours.  Recent Results (from the past 240 hour(s))  MRSA PCR Screening     Status: None    Collection Time: 09/23/17  1:19 AM  Result Value Ref Range Status   MRSA by PCR NEGATIVE NEGATIVE Final    Comment:        The GeneXpert MRSA Assay (FDA approved for NASAL specimens only), is one component of a comprehensive MRSA colonization surveillance program. It is not intended to diagnose MRSA infection nor to guide or monitor treatment for MRSA infections.     Radiology Studies: Dg Abd 1 View  Result Date: 09/23/2017 CLINICAL DATA:  NG tube placement . EXAM: ABDOMEN - 1 VIEW COMPARISON:  No recent prior . FINDINGS: NG tube noted with tip projected over the stomach. EKG leads noted over the chest. Surgical clips right upper quadrant. IMPRESSION: NG tube noted with tip projected over the stomach. Electronically Signed   By: Marcello Moores  Register   On: 09/23/2017 09:58   Dg Abd Portable 1v-small Bowel Obstruction Protocol-initial, 8 Hr Delay  Result Date: 09/24/2017 CLINICAL DATA:  Small bowel obstruction.  8 hour delay. EXAM: PORTABLE ABDOMEN - 1 VIEW COMPARISON:  Fluoroscopy from yesterday FINDINGS: There is a nasogastric tube with tip at the pylorus. Administered oral contrast opacifies the colon. No dilated bowel. Cholecystectomy clips. IMPRESSION: 1. Oral contrast reached nondilated colon. No dilated bowel to suggest obstruction. 2. Nasogastric tube with tip at the pylorus. Electronically Signed   By: Monte Fantasia M.D.   On: 09/24/2017 08:09   US Pelvic Complete With Transvaginal  Result Date: 09/23/2017 CLINICAL DATA:  Cystic right ovarian lesion on outside CT. Remote history of ruptured ectopic, with the patient being unsure if the left ovary and fallopian tube were removed at that time. EXAM: TRANSABDOMINAL AND TRANSVAGINAL ULTRASOUND OF PELVIS TECHNIQUE: Both transabdominal and transvaginal ultrasound examinations of the pelvis were performed. Transabdominal technique was performed for global imaging of the pelvis including uterus, ovaries, adnexal regions, and pelvic  cul-de-sac. It was necessary to proceed with endovaginal exam following the transabdominal exam to visualize the ovaries. COMPARISON:  None FINDINGS: Examination is limited by patient body habitus. Uterus Measurements: 8.4 x 4.2 x 6.0 cm. 2.2 x 1.4 x 2.2 cm intramural mass in the left aspect of the uterine fundus with a few echogenic foci suggesting calcification, most compatible with a fibroid. Endometrium Thickness: 8 mm, upper limits of normal in a postmenopausal patient. No focal  abnormality visualized. Right ovary Measurements: 2.7 x 4.3 x 5.2 cm. Simple appearing 1.9 x 1.5 x 2.1 cm cyst. Left ovary Not visualized. Other findings No abnormal free fluid. IMPRESSION: 1. 2.1 cm right ovarian cyst. This is almost certainly benign, but follow up ultrasound is recommended in 1 year according to the Society of Radiologists in [place] Statement (D Clovis Riley et al. Management of Asymptomatic Ovarian and Other Adnexal Cysts Imaged at [place]: Society of Radiologists in [place] Statement 2010. Radiology 256 (Sept 2010): 943-954.). 2. Nonvisualization of the left ovary. 3. 2.2 cm uterine fibroid. Electronically Signed   By: Logan Bores M.D.   On: 09/23/2017 10:45   Scheduled Meds: . heparin  5,000 Units Subcutaneous Q8H  . insulin aspart  0-5 Units Subcutaneous QHS  . insulin aspart  0-9 Units Subcutaneous TID WC  . insulin glargine  10 Units Subcutaneous Daily   Continuous Infusions:   LOS: 1 day   Kerney Elbe, DO [facility] Pager [phone]  If 7PM-7AM, please contact night-coverage [email or web address] Password [email or web address] 09/24/2017, 1:15 PM

## 2017-09-24 NOTE — Progress Notes (Signed)
Inpatient Diabetes Program Recommendations  AACE/ADA: New Consensus Statement on Inpatient Glycemic Control (2015)  Target Ranges:  Prepandial:   less than 140 mg/dL      Peak postprandial:   less than 180 mg/dL (1-2 hours)      Critically ill patients:  140 - 180 mg/dL  Results for Caitlin Stone, Caitlin Stone (MRN [id number]) as of 09/24/2017 10:57  Ref. Range 09/23/2017 08:22 09/23/2017 11:36 09/23/2017 20:02 09/24/2017 04:35 09/24/2017 08:07  Glucose-Capillary Latest Ref Range: 65 - 99 mg/dL 319 (H) 182 (H) 136 (H) 226 (H) 204 (H)    Review of Glycemic Control Current orders for Inpatient glycemic control: Novolog 0-9 units TID with meals  Inpatient Diabetes Program Recommendations: Insulin - Basal: Please consider ordering Lantus 10 units Q24H starting now (based on 106 kg x 0.10 units). Correction (SSI): Please consider ordering Novolog 0-5 units QHS for bedtime correction scale. HgbA1C: Please order an A1C to evaluate glycemic control over the past 2-3 months.  Thanks, Barnie Alderman, RN, MSN, CDE Diabetes Coordinator Inpatient Diabetes Program [phone] (Team Pager from 8am to 5pm)

## 2017-09-25 LAB — CBC WITH DIFFERENTIAL/PLATELET
Basophils Absolute: 0 10*3/uL (ref 0.0–0.1)
Basophils Relative: 0 %
Eosinophils Absolute: 0.3 10*3/uL (ref 0.0–0.7)
Eosinophils Relative: 4 %
HCT: 40.9 % (ref 36.0–46.0)
Hemoglobin: 13.5 g/dL (ref 12.0–15.0)
Lymphocytes Relative: 35 %
Lymphs Abs: 3.3 10*3/uL (ref 0.7–4.0)
MCH: 29.9 pg (ref 26.0–34.0)
MCHC: 33 g/dL (ref 30.0–36.0)
MCV: 90.7 fL (ref 78.0–100.0)
Monocytes Absolute: 0.9 10*3/uL (ref 0.1–1.0)
Monocytes Relative: 10 %
Neutro Abs: 4.9 10*3/uL (ref 1.7–7.7)
Neutrophils Relative %: 51 %
Platelets: 218 10*3/uL (ref 150–400)
RBC: 4.51 MIL/uL (ref 3.87–5.11)
RDW: 17.6 % — ABNORMAL HIGH (ref 11.5–15.5)
WBC: 9.5 10*3/uL (ref 4.0–10.5)

## 2017-09-25 LAB — COMPREHENSIVE METABOLIC PANEL
ALT: 17 U/L (ref 14–54)
AST: 15 U/L (ref 15–41)
Albumin: 3 g/dL — ABNORMAL LOW (ref 3.5–5.0)
Alkaline Phosphatase: 147 U/L — ABNORMAL HIGH (ref 38–126)
Anion gap: 15 (ref 5–15)
BUN: 31 mg/dL — ABNORMAL HIGH (ref 6–20)
CO2: 28 mmol/L (ref 22–32)
Calcium: 8.7 mg/dL — ABNORMAL LOW (ref 8.9–10.3)
Chloride: 90 mmol/L — ABNORMAL LOW (ref 101–111)
Creatinine, Ser: 9.24 mg/dL — ABNORMAL HIGH (ref 0.44–1.00)
GFR calc Af Amer: 5 mL/min — ABNORMAL LOW (ref >60)
GFR calc non Af Amer: 4 mL/min — ABNORMAL LOW (ref >60)
Glucose, Bld: 206 mg/dL — ABNORMAL HIGH (ref 65–99)
Potassium: 4.3 mmol/L (ref 3.5–5.1)
Sodium: 133 mmol/L — ABNORMAL LOW (ref 135–145)
Total Bilirubin: 0.5 mg/dL (ref 0.3–1.2)
Total Protein: 7.2 g/dL (ref 6.5–8.1)

## 2017-09-25 LAB — GLUCOSE, CAPILLARY
Glucose-Capillary: 199 mg/dL — ABNORMAL HIGH (ref 65–99)
Glucose-Capillary: 200 mg/dL — ABNORMAL HIGH (ref 65–99)
Glucose-Capillary: 356 mg/dL — ABNORMAL HIGH (ref 65–99)
Glucose-Capillary: 262 mg/dL — ABNORMAL HIGH (ref 65–99)

## 2017-09-25 LAB — HEMOGLOBIN A1C
Hgb A1c MFr Bld: 8.9 % — ABNORMAL HIGH (ref 4.8–5.6)
Mean Plasma Glucose: 208.73 mg/dL

## 2017-09-25 LAB — MAGNESIUM: Magnesium: 2.3 mg/dL (ref 1.7–2.4)

## 2017-09-25 LAB — PHOSPHORUS: Phosphorus: 7.5 mg/dL — ABNORMAL HIGH (ref 2.5–4.6)

## 2017-09-25 MED ORDER — PENTAFLUOROPROP-TETRAFLUOROETH EX AERO: 1.0000 "application " | INHALATION_SPRAY | CUTANEOUS | Status: DC | PRN

## 2017-09-25 MED ORDER — HEPARIN SODIUM (PORCINE) 1000 UNIT/ML DIALYSIS: 1000.0000 [IU] | INTRAMUSCULAR | Status: DC | PRN

## 2017-09-25 MED ORDER — LIDOCAINE-PRILOCAINE 2.5-2.5 % EX CREA: 1.0000 "application " | TOPICAL_CREAM | CUTANEOUS | Status: DC | PRN

## 2017-09-25 MED ORDER — HYDROMORPHONE HCL 1 MG/ML IJ SOLN
INTRAMUSCULAR | Status: AC
  Filled 2017-09-25: qty 1

## 2017-09-25 MED ORDER — HEPARIN SODIUM (PORCINE) 1000 UNIT/ML DIALYSIS: 20.0000 [IU]/kg | INTRAMUSCULAR | Status: DC | PRN

## 2017-09-25 MED ORDER — SODIUM CHLORIDE 0.9 % IV SOLN: 100.0000 mL | INTRAVENOUS | Status: DC | PRN

## 2017-09-25 MED ORDER — FAMOTIDINE 20 MG PO TABS
20.0000 mg | ORAL_TABLET | Freq: Two times a day (BID) | ORAL | Status: DC
  Administered 2017-09-25 (×2): 20 mg via ORAL
  Filled 2017-09-25 (×2): qty 1

## 2017-09-25 MED ORDER — LIDOCAINE HCL (PF) 1 % IJ SOLN: 5.0000 mL | INTRAMUSCULAR | Status: DC | PRN

## 2017-09-25 NOTE — Procedures (Signed)
I was present at this dialysis session. I have reviewed the session itself and made appropriate changes.   Tol PO and with BM yesterday.  Having some abd pain, mild, into back.  Lipase was WNL ad admission, LFTs WNL.  2K bath.  No inpatient renal issues.  Next HD 10/14.   Filed Weights   09/23/17 2003 09/24/17 2101 09/25/17 0735  Weight: 106.8 kg (235 lb 7.2 oz) 106.9 kg (235 lb 10.8 oz) 107.2 kg (236 lb 5.3 oz)     Recent Labs Lab 09/25/17 0408  NA 133*  K 4.3  CL 90*  CO2 28  GLUCOSE 206*  BUN 31*  CREATININE 9.24*  CALCIUM 8.7*  PHOS 7.5*     Recent Labs Lab 09/23/17 0436 09/24/17 0850 09/25/17 0408  WBC 15.5* 12.0* 9.5  NEUTROABS  --  7.7 4.9  HGB 14.5 13.6 13.5  HCT 44.3 42.4 40.9  MCV 89.5 91.4 90.7  PLT 260 220 218    Scheduled Meds: . heparin  5,000 Units Subcutaneous Q8H  . insulin aspart  0-5 Units Subcutaneous QHS  . insulin aspart  0-9 Units Subcutaneous TID WC  . insulin glargine  10 Units Subcutaneous Daily   Continuous Infusions: . sodium chloride    . sodium chloride     PRN Meds:.sodium chloride, sodium chloride, acetaminophen **OR** acetaminophen, heparin, heparin, HYDROmorphone (DILAUDID) injection, lidocaine (PF), lidocaine-prilocaine, ondansetron (ZOFRAN) IV, pentafluoroprop-tetrafluoroeth, phenol   Pearson Grippe  MD 09/25/2017, 9:36 AM

## 2017-09-25 NOTE — Progress Notes (Signed)
PROGRESS NOTE    Caitlin Stone  OKH:[id number] DOB: [birth year] DOA: 09/23/2017 PCP: Fleet Contras, MD   Brief Narrative:  Caitlin Stone  is a 66 y.o. female, w Hypertension, DM2, ESRD on HD (M, W, F), prior Cholecystectomy, lysis of adhesions, who presented w c/o abdominal pain and n/v for the past 2 days.  Pt states that abdominal pain generalized. Denies fever, chills, diarrhea, brbpr, black stool.  Pt presented to ED ,but wait was too long so went to [facility].  CT scan there showed SBO with transition in the lower mid to right anterior pelvis as well as a cystic lesion could reporesent neoplasm over the right ovary. On Admission  Wbc 14, Hgb 15, Bun/creat 24/7.7, apparently Dr. Donne Hazel notified and recommended general surgery consult at [facility] and NGT to low intermittent suction.  Pt could not tolerate NGT placement so was placed by IR. Patient improved and NGT removed and patient to be started on a Clear Liquid Diet by General Surgery. Diet was advanced to Soft Diet. Nephrology following for Maintenance of HD and patient underwent Dialysis today. Per my conversation with General Surgery PA, will monitor another 24 hours to see if patient is able to tolerate Solid Diet without issues as she was having mild Nausea this AM.   Assessment & Plan:   Principal Problem:   SBO (small bowel obstruction) ([place]) Active Problems:   ESRD on dialysis ([place])   Hyponatremia   Type II diabetes mellitus with complication ([place])   Essential hypertension, benign   Ovarian mass   Hyperkalemia  Nausea/Vomiting/Abdominal Pain 2/2 to Acute SBO; improved -CT Scan done at [facility] ED; Showed findings consistent with a SBO. Transition point appeared to be within the Mid to Right Anterior Pelvis   -Clear Liquid Diet advanced to Soft Diet by General Surgery; Now starting patient on Renal Carb Modified Diet  -NGT placed by IR; NGT to low intermittent suction to be D/C'd by General  Surgery -Abdominal Flat Plate 09/24/17 showed oral contrast reached non-dilated colon. No dilated bowel to suggest obstruction. 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 Differential includes multiple cysts, multicystic mass and hydrosalpinx. Recommend further evaluation with pelvic ultrasound. -Transvaginal ultrasound ordered and showed 2.1 cm Right Ovarian Cyst, Non-visualization of the Left Ovary, and a 2.2 cm Uterine Fibroid. -Recommend 1 year follow up U/S  Diabetes Mellitus Type 2 -Checked HbA1c 8.9 this AM  -Added Lantus 10 units and C/w Sensitive Novolog SSI AC and add HS per Diabetes Education Coordinator  -CBG's ranging from 199-231 -Patient on Renal/Carb modified Diet now   Hyperkalemia -Improved with Dialysis -K+ went from 5.9 -> 4.5 -> 4.3 -Continue to Monitor and Repeat CMP in AM   Hyponatremia -Mild at 133; Improved from 131 -Gentle Hydration with IV NS  discontinued -Repeat CMP in AM   Hypertension -BP has been on the [place] -Home Meds of Carvedilol 6.25 mg po Daily and Valsartan 160 mg po Daily currently being held -Will add IV Hydralazine as Necessary   Hx of CHF (unknown whether Diastolic or Systolic) -Holding Carvedilol and Valsartan currently and may resume in AM -Gets  Dialyzed MWF for Volume Maintenance  -Continue to Monitor   Heartburn/GERD -C/w Famotidine 20 mg po BID  DVT prophylaxis: Heparin 5,000 units sq q8h Code Status: FULL CODE Family Communication: No family present at bedside Disposition Plan: Likely D/C Home in AM   Consultants:   Nephrology  General Surgery (signed off 09/25/17)  Interventional Radiology    Procedures:  NGT placed by IR   Antimicrobials:  Anti-infectives    None     Subjective: Seen and examined in Dialysis and stated her back was hurting and that she was nauseous. States she had a BM last night and is passing flatus. No CP or SOB.   Objective: Vitals:   09/25/17 1045 09/25/17 1100 09/25/17 1130 09/25/17 1139  BP: (!) 87/43 (!) 105/50 127/64 (!) 156/66  Pulse: 90 90 92 92  Resp:    15  Temp:    98.2 F (36.8 C)  TempSrc:    Oral  SpO2:    98%  Weight:    104.8 kg (231 lb 0.7 oz)    Intake/Output Summary (Last 24 hours) at 09/25/17 1414 Last data filed at 09/25/17 1139  Gross per 24 hour  Intake                0 ml  Output             1800 ml  Net            -1800 ml   Filed Weights   09/24/17 2101 09/25/17 0735 09/25/17 1139  Weight: 106.9 kg (235 lb 10.8 oz) 107.2 kg (236 lb 5.3 oz) 104.8 kg (231 lb 0.7 oz)   Examination: Physical Exam:  Constitutional: WN/WD obese AAF in NAD. Appears calm but uncomfortable in pain Eyes: Sclerae anicteric. Lids normal ENMT: External Ears appear normal. MMM Neck: Supple with no JVD Respiratory: Diminished to Auscultation. No wheezing/rales/rhonchi. Patient was not tachypenic or using any accessory muscles to breathe Cardiovascular: RRR; No m/r/g; S1, S2. No Extremity Edema Abdomen: Soft, Mildly tender to palpate. Distended due to body habitus GU: Deferred Musculoskeletal: No cyanosis. No contractures Skin: Warm and Dry. No rashes or lesions on a limited skin eval Neurologic: CN 2-12 grossly intact. No appreciable focal deficits Psychiatric: Normal  mood and affect. Intact judgement and insight  Data Reviewed: I have personally reviewed following labs and imaging studies  CBC:  Recent Labs Lab 09/22/17 1642 09/23/17 0436 09/24/17 0850 09/25/17 0408  WBC 16.7* 15.5* 12.0* 9.5  NEUTROABS  --   --  7.7 4.9  HGB 15.3* 14.5 13.6 13.5  HCT 46.3* 44.3 42.4 40.9  MCV 90.6 89.5 91.4 90.7  PLT 256 260 220 355   Basic Metabolic Panel:  Recent Labs Lab 09/22/17 1642 09/23/17 0436 09/24/17 0850 09/25/17 0408  NA 132* 131* 133* 133*  K 4.6 5.9* 4.5 4.3  CL 89* 85* 91* 90*  CO2 22 27 23 28   GLUCOSE 264* 341* 221* 206*  BUN 17 31* 21* 31*  CREATININE 7.00* 9.35* 7.42* 9.24*  CALCIUM 8.9 9.0 8.7* 8.7*  MG  --   --  2.2 2.3  PHOS  --   --  6.9* 7.5*   GFR: CrCl cannot be calculated (Unknown ideal weight.). Liver Function Tests:  Recent Labs Lab 09/22/17 1642 09/23/17 0436 09/24/17 0850 09/25/17 0408  AST 27 23 17 15   ALT 23 23 19 17   ALKPHOS 200* 187* 147* 147*  BILITOT 0.9 0.8 1.2 0.5  PROT 8.8* 8.3* 7.4 7.2  ALBUMIN 3.7 3.4* 3.1* 3.0*    Recent Labs Lab 09/22/17 1642  LIPASE 18   No results for input(s): AMMONIA in the last 168 hours. Coagulation Profile: No results for input(s): INR, PROTIME in the last 168 hours. Cardiac Enzymes: No results for input(s): CKTOTAL, CKMB, CKMBINDEX, TROPONINI in the last 168 hours. BNP (last 3 results) No results for input(s): PROBNP in the last 8760 hours. HbA1C:  Recent Labs  09/25/17 0408  HGBA1C 8.9*   CBG:  Recent Labs Lab 09/24/17 1204 09/24/17 1643 09/24/17 2039 09/25/17 0445 09/25/17 1139  GLUCAP 267* 368* 231* 199* 200*   Lipid Profile: No results for input(s): CHOL, HDL, LDLCALC, TRIG, CHOLHDL, LDLDIRECT in the last 72 hours. Thyroid Function Tests: No results for input(s): TSH, T4TOTAL, FREET4, T3FREE, THYROIDAB in the last 72 hours. Anemia Panel: No results for input(s): VITAMINB12, FOLATE, FERRITIN, TIBC, IRON, RETICCTPCT in the last 72  hours. Sepsis Labs: No results for input(s): PROCALCITON, LATICACIDVEN in the last 168 hours.  Recent Results (from the past 240 hour(s))  MRSA PCR Screening     Status: None   Collection Time: 09/23/17  1:19 AM  Result Value Ref Range Status   MRSA by PCR NEGATIVE NEGATIVE Final    Comment:        The GeneXpert MRSA Assay (FDA approved for NASAL specimens only), is one component of a comprehensive MRSA colonization surveillance program. It is not intended to diagnose MRSA infection nor to guide or monitor treatment for MRSA infections.     Radiology Studies: Dg Abd Portable 1v-small Bowel Obstruction Protocol-initial, 8 Hr Delay  Result Date: 09/24/2017 CLINICAL DATA:  Small bowel obstruction.  8 hour delay. EXAM: PORTABLE ABDOMEN - 1 VIEW COMPARISON:  Fluoroscopy from yesterday FINDINGS: There is a nasogastric tube with tip at the pylorus. Administered oral contrast opacifies the colon. No dilated bowel. Cholecystectomy clips. IMPRESSION: 1. Oral contrast reached nondilated colon. No dilated bowel to suggest obstruction. 2. Nasogastric tube with tip at the pylorus. Electronically Signed   By: Monte Fantasia M.D.   On: 09/24/2017 08:09   Scheduled Meds: . famotidine  20 mg Oral BID  . heparin  5,000 Units Subcutaneous Q8H  . insulin aspart  0-5 Units Subcutaneous QHS  . insulin aspart  0-9 Units Subcutaneous TID WC  . insulin glargine  10 Units Subcutaneous Daily   Continuous Infusions:   LOS: 2 days   Kerney Elbe, DO [facility] Pager [phone]  If 7PM-7AM, please contact night-coverage [email or web address] Password [email or web address] 09/25/2017, 2:14 PM

## 2017-09-25 NOTE — Progress Notes (Signed)
Patient ID: Caitlin Stone, female   DOB: [birth year], 66 y.o.   MRN: [id number]  [facility] Surgery Progress Note     Subjective: CC- SBO Patient states that she is having heartburn, but denies any crampy abdominal pain. She tolerated a soft diet for lunch. Denies n/v. Passing flatus. Feels like she may have another BM.  Objective: Vital signs in last 24 hours: Temp:  [98.2 F (36.8 C)-98.9 F (37.2 C)] 98.2 F (36.8 C) (10/11 1139) Pulse Rate:  [76-92] 92 (10/11 1139) Resp:  [11-22] 15 (10/11 1139) BP: (87-156)/(43-74) 156/66 (10/11 1139) SpO2:  [96 %-98 %] 98 % (10/11 1139) Weight:  [231 lb 0.7 oz (104.8 kg)-236 lb 5.3 oz (107.2 kg)] 231 lb 0.7 oz (104.8 kg) (10/11 1139) Last BM Date: 09/24/17  Intake/Output from previous day: 10/10 0701 - 10/11 0700 In: 840 [P.O.:840] Out: 0  Intake/Output this shift: Total I/O In: -  Out: 1800 [Other:1800]  PE: Gen:  Alert, NAD, pleasant HEENT: EOM's intact, pupils equal and round Card:  RRR, no M/G/R heard Pulm:  CTAB, no W/R/R, effort normal Abd: Soft, mild distension, +BS, no HSM, no hernia, nontender Psych: A&Ox3  Skin: no rashes noted, warm and dry  Lab Results:   Recent Labs  09/24/17 0850 09/25/17 0408  WBC 12.0* 9.5  HGB 13.6 13.5  HCT 42.4 40.9  PLT 220 218   BMET  Recent Labs  09/24/17 0850 09/25/17 0408  NA 133* 133*  K 4.5 4.3  CL 91* 90*  CO2 23 28  GLUCOSE 221* 206*  BUN 21* 31*  CREATININE 7.42* 9.24*  CALCIUM 8.7* 8.7*   PT/INR No results for input(s): LABPROT, INR in the last 72 hours. CMP     Component Value Date/Time   NA 133 (L) 09/25/2017 0408   K 4.3 09/25/2017 0408   CL 90 (L) 09/25/2017 0408   CO2 28 09/25/2017 0408   GLUCOSE 206 (H) 09/25/2017 0408   BUN 31 (H) 09/25/2017 0408   CREATININE 9.24 (H) 09/25/2017 0408   CALCIUM 8.7 (L) 09/25/2017 0408   PROT 7.2 09/25/2017 0408   ALBUMIN 3.0 (L) 09/25/2017 0408   AST 15 09/25/2017 0408   ALT 17 09/25/2017 0408   ALKPHOS 147  (H) 09/25/2017 0408   BILITOT 0.5 09/25/2017 0408   GFRNONAA 4 (L) 09/25/2017 0408   GFRAA 5 (L) 09/25/2017 0408   Lipase     Component Value Date/Time   LIPASE 18 09/22/2017 1642       Studies/Results: Dg Abd Portable 1v-small Bowel Obstruction Protocol-initial, 8 Hr Delay  Result Date: 09/24/2017 CLINICAL DATA:  Small bowel obstruction.  8 hour delay. EXAM: PORTABLE ABDOMEN - 1 VIEW COMPARISON:  Fluoroscopy from yesterday FINDINGS: There is a nasogastric tube with tip at the pylorus. Administered oral contrast opacifies the colon. No dilated bowel. Cholecystectomy clips. IMPRESSION: 1. Oral contrast reached nondilated colon. No dilated bowel to suggest obstruction. 2. Nasogastric tube with tip at the pylorus. Electronically Signed   By: Monte Fantasia M.D.   On: 09/24/2017 08:09    Anti-infectives: Anti-infectives    None       Assessment/Plan ESRD on dialysis MWF HTN DM CHF  ? Ovarian neoplasm on CT scan - transvaginal u/s pending ? 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 Advance to HH/carb modified diet. Add pepcid for heartburn. Encourage ambulation. General surgery will sign off, please call with concerns.   LOS: 2 days    Wellington Hampshire , [facility] Surgery 09/25/2017, 2:02 PM Pager: [phone] Consults: [phone] Mon-Fri 7:00 am-4:30 pm Sat-Sun 7:00 am-11:30 am

## 2017-09-26 ENCOUNTER — Inpatient Hospital Stay (HOSPITAL_COMMUNITY): Payer: Medicare Other

## 2017-09-26 ENCOUNTER — Encounter (HOSPITAL_COMMUNITY): Payer: Self-pay | Admitting: General Practice

## 2017-09-26 DIAGNOSIS — R109 Unspecified abdominal pain: Secondary | ICD-10-CM

## 2017-09-26 DIAGNOSIS — N939 Abnormal uterine and vaginal bleeding, unspecified: Secondary | ICD-10-CM

## 2017-09-26 LAB — CBC WITH DIFFERENTIAL/PLATELET
Basophils Absolute: 0 10*3/uL (ref 0.0–0.1)
Basophils Relative: 0 %
Eosinophils Absolute: 0.3 10*3/uL (ref 0.0–0.7)
Eosinophils Relative: 3 %
HCT: 41.9 % (ref 36.0–46.0)
Hemoglobin: 13.6 g/dL (ref 12.0–15.0)
Lymphocytes Relative: 31 %
Lymphs Abs: 3.3 10*3/uL (ref 0.7–4.0)
MCH: 29.6 pg (ref 26.0–34.0)
MCHC: 32.5 g/dL (ref 30.0–36.0)
MCV: 91.1 fL (ref 78.0–100.0)
Monocytes Absolute: 1.2 10*3/uL — ABNORMAL HIGH (ref 0.1–1.0)
Monocytes Relative: 11 %
Neutro Abs: 5.9 10*3/uL (ref 1.7–7.7)
Neutrophils Relative %: 55 %
Platelets: 150 10*3/uL (ref 150–400)
RBC: 4.6 MIL/uL (ref 3.87–5.11)
RDW: 17.7 % — ABNORMAL HIGH (ref 11.5–15.5)
WBC: 10.8 10*3/uL — ABNORMAL HIGH (ref 4.0–10.5)

## 2017-09-26 LAB — MAGNESIUM: Magnesium: 2.1 mg/dL (ref 1.7–2.4)

## 2017-09-26 LAB — COMPREHENSIVE METABOLIC PANEL
ALT: 16 U/L (ref 14–54)
AST: 21 U/L (ref 15–41)
Albumin: 3.1 g/dL — ABNORMAL LOW (ref 3.5–5.0)
Alkaline Phosphatase: 150 U/L — ABNORMAL HIGH (ref 38–126)
Anion gap: 17 — ABNORMAL HIGH (ref 5–15)
BUN: 19 mg/dL (ref 6–20)
CO2: 25 mmol/L (ref 22–32)
Calcium: 8.7 mg/dL — ABNORMAL LOW (ref 8.9–10.3)
Chloride: 90 mmol/L — ABNORMAL LOW (ref 101–111)
Creatinine, Ser: 7.09 mg/dL — ABNORMAL HIGH (ref 0.44–1.00)
GFR calc Af Amer: 6 mL/min — ABNORMAL LOW (ref >60)
GFR calc non Af Amer: 5 mL/min — ABNORMAL LOW (ref >60)
Glucose, Bld: 262 mg/dL — ABNORMAL HIGH (ref 65–99)
Potassium: 4.4 mmol/L (ref 3.5–5.1)
Sodium: 132 mmol/L — ABNORMAL LOW (ref 135–145)
Total Bilirubin: 0.6 mg/dL (ref 0.3–1.2)
Total Protein: 7.6 g/dL (ref 6.5–8.1)

## 2017-09-26 LAB — GLUCOSE, CAPILLARY
Glucose-Capillary: 398 mg/dL — ABNORMAL HIGH (ref 65–99)
Glucose-Capillary: 155 mg/dL — ABNORMAL HIGH (ref 65–99)
Glucose-Capillary: 274 mg/dL — ABNORMAL HIGH (ref 65–99)

## 2017-09-26 LAB — PHOSPHORUS: Phosphorus: 6.1 mg/dL — ABNORMAL HIGH (ref 2.5–4.6)

## 2017-09-26 MED ORDER — FAMOTIDINE 20 MG PO TABS: 20.0000 mg | ORAL_TABLET | Freq: Two times a day (BID) | ORAL | 0 refills | Status: AC

## 2017-09-26 MED ORDER — TRAMADOL HCL 50 MG PO TABS: 50.0000 mg | ORAL_TABLET | Freq: Four times a day (QID) | ORAL | 0 refills | Status: AC | PRN

## 2017-09-26 MED ORDER — DIPHENHYDRAMINE HCL 25 MG PO CAPS
ORAL_CAPSULE | ORAL | Status: AC
  Filled 2017-09-26: qty 1

## 2017-09-26 MED ORDER — DIPHENHYDRAMINE HCL 25 MG PO CAPS
25.0000 mg | ORAL_CAPSULE | Freq: Once | ORAL | Status: AC
  Administered 2017-09-26: 25 mg via ORAL

## 2017-09-26 MED ORDER — ALTEPLASE 2 MG IJ SOLR: 2.0000 mg | Freq: Once | INTRAMUSCULAR | Status: DC | PRN

## 2017-09-26 MED ORDER — ONDANSETRON HCL 4 MG PO TABS: 4.0000 mg | ORAL_TABLET | Freq: Three times a day (TID) | ORAL | 0 refills | Status: AC | PRN

## 2017-09-26 NOTE — Progress Notes (Signed)
Discharge instructions and prescriptions given, pt verbalized understanding.  Pt left floor via wheelchair accompanied by staff and family.

## 2017-09-26 NOTE — Procedures (Signed)
I was present at this dialysis session. I have reviewed the session itself and made appropriate changes.   Still off / on upper abd pain, improved. Having high VP.   Pulling clots.  Need to resume heparin at outpt HD.  Will be below EDW upon DC. We will also facilitated expedited AVG eval at [facility].   Filed Weights   09/25/17 1139 09/25/17 2035 09/26/17 0755  Weight: 104.8 kg (231 lb 0.7 oz) 106.8 kg (235 lb 8 oz) 106.1 kg (233 lb 14.5 oz)     Recent Labs Lab 09/26/17 0146  NA 132*  K 4.4  CL 90*  CO2 25  GLUCOSE 262*  BUN 19  CREATININE 7.09*  CALCIUM 8.7*  PHOS 6.1*     Recent Labs Lab 09/24/17 0850 09/25/17 0408 09/26/17 0146  WBC 12.0* 9.5 10.8*  NEUTROABS 7.7 4.9 5.9  HGB 13.6 13.5 13.6  HCT 42.4 40.9 41.9  MCV 91.4 90.7 91.1  PLT 220 218 150    Scheduled Meds: . famotidine  20 mg Oral BID  . heparin  5,000 Units Subcutaneous Q8H  . insulin aspart  0-5 Units Subcutaneous QHS  . insulin aspart  0-9 Units Subcutaneous TID WC  . insulin glargine  10 Units Subcutaneous Daily   Continuous Infusions: PRN Meds:.acetaminophen **OR** acetaminophen, alteplase, HYDROmorphone (DILAUDID) injection, ondansetron (ZOFRAN) IV, phenol   Pearson Grippe  MD 09/26/2017, 8:30 AM

## 2017-09-26 NOTE — Progress Notes (Signed)
   09/26/17 1000  PT Visit Information  Last PT Received On 09/25/17  Reason Eval/Treat Not Completed Patient at procedure or test/unavailable   Pt is in HD and will try later as time and pt allow.  Mee Hives, PT MS Acute Rehab Dept. Number: [place]

## 2017-09-26 NOTE — Progress Notes (Signed)
Pt c/o bleeding from vagina after intravaginal ultrasound on Tuesday.  Paged Dr. Alfredia Ferguson and advised of complaint.

## 2017-09-26 NOTE — Progress Notes (Signed)
Results for NIA, NATHANIEL (MRN [id number]) as of 09/26/2017 14:05  Ref. Range 09/25/2017 16:50 09/25/2017 20:37 09/26/2017 07:22 09/26/2017 09:59 09/26/2017 13:08  Glucose-Capillary Latest Ref Range: 65 - 99 mg/dL 356 (H) 262 (H) 398 (H) 155 (H) 274 (H)  Noted that blood sugars continue to be elevated.  Recommend increasing Lantus to 20 units daily (weight based 0.2 units X 105 kg) and continue Novolog correction scale as ordered.  Will continue to monitor blood sugars while in the hospital.  Harvel Ricks RN BSN CDE Diabetes Coordinator Pager: [phone]  8am-5pm

## 2017-09-26 NOTE — Progress Notes (Signed)
Pt gone down for abdominal X-ray.

## 2017-09-26 NOTE — Discharge Summary (Signed)
Physician Discharge Summary  Caitlin Stone HWE:[id number] DOB: 12/04/1951 DOA: 09/23/2017  PCP: Lucianne Lei, MD  Admit date: 09/23/2017 Discharge date: 09/26/2017  Admitted From: Home Disposition: Home  Recommendations for Outpatient Follow-up:  1. Follow up with PCP in 1-2 weeks 2. Follow up with Nephrology and Resume Dialysis Schedules 3. Follow up with OB-GYN for Vaginal Bleeding (Suspected [place] from Transvaginal U/S) and Ovarian Cystic Lesion 4. Patient will need repeat AF and possible Fistulogram as an outpatient 5. Please obtain CMP/CBC, Mag, Phos in one week 6. Please follow up on the following pending results:  Home Health: No Equipment/Devices: None  Discharge Condition: Stable CODE STATUS: FULL CODE Diet recommendation: Renal/Carb Modified Diet   Brief/Interim Summary: [name] a 66 y.o.female,w Hypertension, DM2, ESRD on HD (M, W, F), prior Cholecystectomy, lysis of adhesions, who presented w c/o abdominal pain and n/v for the past 2 days. Pt states that abdominal pain generalized. Denies fever, chills, diarrhea, brbpr, black stool. Pt presented to ED ,but wait was too long so went to [facility]. CT scan there showed SBO with transition in the lower mid to right anterior pelvis as well as a cystic lesion could reporesent neoplasm over the right ovary. On Admission Wbc 14, Hgb 15, Bun/creat 24/7.7, apparently Dr. Donne Hazel notified and recommended general surgery consult at [facility] and NGT to low intermittent suction. Pt could not tolerate NGT placement so was placed by IR. Patient improved and NGT removed and patient to be started on a Clear Liquid Diet by General Surgery. Diet was advanced to Soft Diet. Nephrology following for Maintenance of HD and patient underwent Dialysis today. Patient had some nausea still and complained of mild Abdominal Pain. KUB obtained and showed no obstruction. She tolerated her diet well. Prior to D/C she noticed some  vaginal bleeding but was not very much. Hb/Hct remained the same. She will need to follow up with PCP and OB-GYN as an outpatient and continue Dialysis as scheduled as she was deemed medically stable to D/C Home.  Discharge Diagnoses:  Principal Problem:   SBO (small bowel obstruction) (HCC) Active Problems:   ESRD on dialysis (DISH)   Hyponatremia   Type II diabetes mellitus with complication (HCC)   Essential hypertension, benign   Ovarian mass   Hyperkalemia  Nausea/Vomiting/Abdominal Pain 2/2 to Acute SBO; improved -CT Scan done at [facility] ED; Showed findings consistent with a SBO. Transition point appeared to be within the Mid to Right Anterior Pelvis   -Clear Liquid Diet advanced to Soft Diet by General Surgery; Now starting patient on Renal Carb Modified Diet and tolerated that well  -NGT was placed by IR; NGT to low intermittent suction to be D/C'd by General Surgery -Abdominal Flat Plate 09/24/17 showed oral contrast reached non-dilated colon. No dilated bowel to suggest obstruction. NGT with at the pylorus  -Further Recc's per General Surgery; General Surgery Signing off but recommended watching patient 24 hours to see if she tolerates solid food without issues and patient did. Still had some Nausea and mild abdominal pain but was passing gas and having bowel movements.  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 Differential includes multiple cysts, multicystic mass and hydrosalpinx. Recommend further evaluation with pelvic ultrasound. -Transvaginal ultrasound ordered and showed 2.1 cm Right Ovarian Cyst, Non-visualization of the Left Ovary, and a 2.2 cm Uterine Fibroid. -Recommend 1 year follow up U/S -Follow up with OB-GYN and PCP as an outpatient   Diabetes Mellitus Type 2 -Checked HbA1c and was 8.9   -Resume Home Insulin Regimen  -CBG's ranging from 155-398 -Patient on Renal/Carb modified Diet now   Hyperkalemia -Improved with Dialysis -K+ went from 5.9 -> 4.5 -> 4.3 -> 4.4 -Continue to Monitor and Repeat CMP as an outpatient   Hyponatremia -Mild at 132;  -Gentle Hydration with IV NS discontinued -Repeat CMP as an outpatient   Hypertension -BP has been on the [place] -Home Meds of Carvedilol 6.25 mg po Daily and Valsartan 160 mg po Daily; Patient states she does not take them anymore -Follow Up with PCP for BP follow up  Hx of CHF (unknown whether Diastolic or Systolic) -Carvedilol and Valsartan currently heldand may resume after discussion with PCP -Gets Dialyzed MWF for Volume Maintenance  -Continue to Monitor   Heartburn/GERD -C/w Famotidine 20 mg po BID  Vaginal Bleeding -Hb/Hct Stable -Likely from trauma from Trans-Vaginal U/S -Follow up with OB-Gyn as an outpatient   Discharge Instructions  Discharge Instructions    Call MD for:  difficulty breathing, headache or visual disturbances    Complete by:  As directed    Call MD for:  hives    Complete by:  As directed    Call MD for:  persistant dizziness or light-headedness    Complete by:  As directed    Call MD for:  persistant nausea and vomiting    Complete by:  As directed    Call MD for:  redness, tenderness, or signs of infection (pain, swelling, redness, odor or green/yellow discharge around  incision site)    Complete by:  As directed    Call MD for:  severe uncontrolled pain    Complete by:  As directed    Call MD for:  temperature >100.4    Complete by:  As directed    Diet - low sodium heart healthy    Complete by:  As directed    Diet Carb Modified    Complete by:  As directed    Discharge instructions    Complete by:  As directed    Follow up with PCP and Ob-Gyn as an outpatient. Take all medications as prescribed. Follow up normal Dialysis Schedule. If symptoms change or worsen please return to the ED for evaluation.   Increase activity slowly    Complete by:  As directed      Allergies as of 09/26/2017      Reactions   Metformin Diarrhea   Latex Rash   Penicillins Rash      Medication List    TAKE these medications   bumetanide 1 MG tablet Commonly known as:  BUMEX Take 1 mg by mouth 2 (two) times daily.   carvedilol 6.25 MG tablet Commonly known as:  COREG Take 6.25 mg by mouth daily.   cyclobenzaprine 5 MG tablet  Commonly known as:  FLEXERIL Take 5 mg by mouth daily as needed for muscle spasms.   ELIQUIS 5 MG Tabs tablet Generic drug:  apixaban Take 5 mg by mouth 2 (two) times daily.   famotidine 20 MG tablet Commonly known as:  PEPCID Take 1 tablet (20 mg total) by mouth 2 (two) times daily. What changed:  when to take this   NOVOLOG FLEXPEN 100 UNIT/ML FlexPen Generic drug:  insulin aspart Inject 15 Units into the skin 3 (three) times daily with meals. Per sliding scale. Max of 60 units.   ondansetron 4 MG tablet Commonly known as:  ZOFRAN Take 1 tablet (4 mg total) by mouth 3 (three) times daily as needed. What changed:  reasons to take this   pravastatin 20 MG tablet Commonly known as:  PRAVACHOL Take 20 mg by mouth daily.   sevelamer carbonate 800 MG tablet Commonly known as:  RENVELA Take 800 mg by mouth 3 (three) times daily with meals.   traMADol 50 MG tablet Commonly known as:  ULTRAM Take 1 tablet (50 mg total) by  mouth every 6 (six) hours as needed.   TRESIBA FLEXTOUCH 100 UNIT/ML Sopn FlexTouch Pen Generic drug:  insulin degludec Inject 42 Units into the skin daily at 10 pm.   ULORIC 40 MG tablet Generic drug:  febuxostat Take 40 mg by mouth daily.   valsartan 160 MG tablet Commonly known as:  DIOVAN Take 160 mg by mouth daily.      Follow-up Information    Lucianne Lei, MD. Call.   Specialty:  Family Medicine Why:  Call to make an appointment within 1 week Contact information: [address] [place] [address] [phone]          Allergies  Allergen Reactions  . Metformin Diarrhea  . Latex Rash  . Penicillins Rash   Consultations: Nephrology General Surgery Diabetes Coordinator  Procedures/Studies: Dg Abd 1 View  Result Date: 09/26/2017 CLINICAL DATA:  Nausea, vomiting and general abdominal pain x 5 days. Hx multiple abdominal surgeries. EXAM: ABDOMEN - 1 VIEW COMPARISON:  None. FINDINGS: There is oral contrast material within the colon. There is interval removal of the nasogastric tube. There is no bowel dilatation to suggest obstruction. There is no evidence of pneumoperitoneum, portal venous gas or pneumatosis. There are no pathologic calcifications along the expected course of the ureters. The osseous structures are unremarkable. IMPRESSION: No bowel obstruction. Electronically Signed   By: Kathreen Devoid   On: 09/26/2017 14:30   Dg Abd 1 View  Result Date: 09/23/2017 CLINICAL DATA:  NG tube placement . EXAM: ABDOMEN - 1 VIEW COMPARISON:  No recent prior . FINDINGS: NG tube noted with tip projected over the stomach. EKG leads noted over the chest. Surgical clips right upper quadrant. IMPRESSION: NG tube noted with tip projected over the stomach. Electronically Signed   By: Marcello Moores  Register   On: 09/23/2017 09:58   Dg Abd Portable 1v-small Bowel Obstruction Protocol-initial, 8 Hr Delay  Result Date: 09/24/2017 CLINICAL DATA:  Small bowel obstruction.  8 hour  delay. EXAM: PORTABLE ABDOMEN - 1 VIEW COMPARISON:  Fluoroscopy from yesterday FINDINGS: There is a nasogastric tube with tip at the pylorus. Administered oral contrast opacifies the colon. No dilated bowel. Cholecystectomy clips. IMPRESSION: 1. Oral contrast reached nondilated colon. No dilated bowel to suggest obstruction. 2. Nasogastric tube with tip at the pylorus. Electronically Signed   By: Monte Fantasia M.D.   On: 09/24/2017 08:09  US Pelvic Complete With Transvaginal  Result Date: 09/23/2017 CLINICAL DATA:  Cystic right ovarian lesion on outside CT. Remote history of ruptured ectopic, with the patient being unsure if the left ovary and fallopian tube were removed at that time. EXAM: TRANSABDOMINAL AND TRANSVAGINAL ULTRASOUND OF PELVIS TECHNIQUE: Both transabdominal and transvaginal ultrasound examinations of the pelvis were performed. Transabdominal technique was performed for global imaging of the pelvis including uterus, ovaries, adnexal regions, and pelvic cul-de-sac. It was necessary to proceed with endovaginal exam following the transabdominal exam to visualize the ovaries. COMPARISON:  None FINDINGS: Examination is limited by patient body habitus. Uterus Measurements: 8.4 x 4.2 x 6.0 cm. 2.2 x 1.4 x 2.2 cm intramural mass in the left aspect of the uterine fundus with a few echogenic foci suggesting calcification, most compatible with a fibroid. Endometrium Thickness: 8 mm, upper limits of normal in a postmenopausal patient. No focal abnormality visualized. Right ovary Measurements: 2.7 x 4.3 x 5.2 cm. Simple appearing 1.9 x 1.5 x 2.1 cm cyst. Left ovary Not visualized. Other findings No abnormal free fluid. IMPRESSION: 1. 2.1 cm right ovarian cyst. This is almost certainly benign, but follow up ultrasound is recommended in 1 year according to the Society of Radiologists in [place] Statement (D Clovis Riley et al. Management of Asymptomatic Ovarian and Other Adnexal Cysts  Imaged at [place]: Society of Radiologists in [place] Statement 2010. Radiology 256 (Sept 2010): 943-954.). 2. Nonvisualization of the left ovary. 3. 2.2 cm uterine fibroid. Electronically Signed   By: Logan Bores M.D.   On: 09/23/2017 10:45     Subjective: Seen and examined and felt nauseous this AM but better this Afternoon. No CP or SOB. Tolerated diet and was passing flatus. No other complaints and ready to go home.  Discharge Exam: Vitals:   09/26/17 1130 09/26/17 1153  BP: (!) 147/64 126/66  Pulse: 80 81  Resp:  18  Temp:  98.1 F (36.7 C)  SpO2:  91%   Vitals:   09/26/17 1030 09/26/17 1100 09/26/17 1130 09/26/17 1153  BP: 139/66 130/65 (!) 147/64 126/66  Pulse: 78 81 80 81  Resp:    18  Temp:    98.1 F (36.7 C)  TempSrc:    Oral  SpO2:    91%  Weight:    105.1 kg (231 lb 11.3 oz)   General: Pt is alert, awake, not in acute distress Cardiovascular: RRR, S1/S2 +, no rubs, no gallops Respiratory: Diminished bilaterally, no wheezing, no rhonchi Abdominal: Soft, Mildly tender, ND, bowel sounds + Extremities: no edema, no cyanosis  The results of significant diagnostics from this hospitalization (including imaging, microbiology, ancillary and laboratory) are listed below for reference.    Microbiology: Recent Results (from the past 240 hour(s))  MRSA PCR Screening     Status: None   Collection Time: 09/23/17  1:19 AM  Result Value Ref Range Status   MRSA by PCR NEGATIVE NEGATIVE Final    Comment:        The GeneXpert MRSA Assay (FDA approved for NASAL specimens only), is one component of a comprehensive MRSA colonization surveillance program. It is not intended to diagnose MRSA infection nor to guide or monitor treatment for MRSA infections.     Labs: BNP (last 3 results) No results for input(s): BNP in the last 8760 hours. Basic Metabolic Panel:  Recent Labs Lab 09/22/17 1642 09/23/17 0436 09/24/17 0850 09/25/17 0408 09/26/17 0146   NA 132* 131* 133* 133* 132*  K 4.6 5.9* 4.5 4.3 4.4  CL 89* 85* 91* 90* 90*  CO2 22 27 23 28 25   GLUCOSE 264* 341* 221* 206* 262*  BUN 17 31* 21* 31* 19  CREATININE 7.00* 9.35* 7.42* 9.24* 7.09*  CALCIUM 8.9 9.0 8.7* 8.7* 8.7*  MG  --   --  2.2 2.3 2.1  PHOS  --   --  6.9* 7.5* 6.1*   Liver Function Tests:  Recent Labs Lab 09/22/17 1642 09/23/17 0436 09/24/17 0850 09/25/17 0408 09/26/17 0146  AST 27 23 17 15 21   ALT 23 23 19 17 16   ALKPHOS 200* 187* 147* 147* 150*  BILITOT 0.9 0.8 1.2 0.5 0.6  PROT 8.8* 8.3* 7.4 7.2 7.6  ALBUMIN 3.7 3.4* 3.1* 3.0* 3.1*    Recent Labs Lab 09/22/17 1642  LIPASE 18   No results for input(s): AMMONIA in the last 168 hours. CBC:  Recent Labs Lab 09/22/17 1642 09/23/17 0436 09/24/17 0850 09/25/17 0408 09/26/17 0146  WBC 16.7* 15.5* 12.0* 9.5 10.8*  NEUTROABS  --   --  7.7 4.9 5.9  HGB 15.3* 14.5 13.6 13.5 13.6  HCT 46.3* 44.3 42.4 40.9 41.9  MCV 90.6 89.5 91.4 90.7 91.1  PLT 256 260 220 218 150   Cardiac Enzymes: No results for input(s): CKTOTAL, CKMB, CKMBINDEX, TROPONINI in the last 168 hours. BNP: Invalid input(s): POCBNP CBG:  Recent Labs Lab 09/25/17 1650 09/25/17 2037 09/26/17 0722 09/26/17 0959 09/26/17 1308  GLUCAP 356* 262* 398* 155* 274*   D-Dimer No results for input(s): DDIMER in the last 72 hours. Hgb A1c  Recent Labs  09/25/17 0408  HGBA1C 8.9*   Lipid Profile No results for input(s): CHOL, HDL, LDLCALC, TRIG, CHOLHDL, LDLDIRECT in the last 72 hours. Thyroid function studies No results for input(s): TSH, T4TOTAL, T3FREE, THYROIDAB in the last 72 hours.  Invalid input(s): FREET3 Anemia work up No results for input(s): VITAMINB12, FOLATE, FERRITIN, TIBC, IRON, RETICCTPCT in the last 72 hours. Urinalysis No results found for: COLORURINE, APPEARANCEUR, [place], [place], [place], [place], [place], [place], PROTEINUR, UROBILINOGEN, NITRITE, LEUKOCYTESUR Sepsis Labs Invalid input(s):  PROCALCITONIN,  WBC,  LACTICIDVEN Microbiology Recent Results (from the past 240 hour(s))  MRSA PCR Screening     Status: None   Collection Time: 09/23/17  1:19 AM  Result Value Ref Range Status   MRSA by PCR NEGATIVE NEGATIVE Final    Comment:        The GeneXpert MRSA Assay (FDA approved for NASAL specimens only), is one component of a comprehensive MRSA colonization surveillance program. It is not intended to diagnose MRSA infection nor to guide or monitor treatment for MRSA infections.    Time coordinating discharge: 35 minutes  SIGNED:  Kerney Elbe, DO [facility] 09/26/2017, 3:40 PM Pager [phone]  If 7PM-7AM, please contact night-coverage [email or web address] Password [email or web address]

## 2017-09-29 DIAGNOSIS — N186 End stage renal disease: Secondary | ICD-10-CM | POA: Diagnosis not present

## 2017-09-29 DIAGNOSIS — E875 Hyperkalemia: Secondary | ICD-10-CM | POA: Diagnosis not present

## 2017-09-29 DIAGNOSIS — E1129 Type 2 diabetes mellitus with other diabetic kidney complication: Secondary | ICD-10-CM | POA: Diagnosis not present

## 2017-09-29 DIAGNOSIS — N2581 Secondary hyperparathyroidism of renal origin: Secondary | ICD-10-CM | POA: Diagnosis not present

## 2017-10-01 DIAGNOSIS — N2581 Secondary hyperparathyroidism of renal origin: Secondary | ICD-10-CM | POA: Diagnosis not present

## 2017-10-01 DIAGNOSIS — E875 Hyperkalemia: Secondary | ICD-10-CM | POA: Diagnosis not present

## 2017-10-01 DIAGNOSIS — N186 End stage renal disease: Secondary | ICD-10-CM | POA: Diagnosis not present

## 2017-10-01 DIAGNOSIS — E1129 Type 2 diabetes mellitus with other diabetic kidney complication: Secondary | ICD-10-CM | POA: Diagnosis not present

## 2017-10-03 DIAGNOSIS — N2581 Secondary hyperparathyroidism of renal origin: Secondary | ICD-10-CM | POA: Diagnosis not present

## 2017-10-03 DIAGNOSIS — N186 End stage renal disease: Secondary | ICD-10-CM | POA: Diagnosis not present

## 2017-10-03 DIAGNOSIS — E875 Hyperkalemia: Secondary | ICD-10-CM | POA: Diagnosis not present

## 2017-10-03 DIAGNOSIS — E1129 Type 2 diabetes mellitus with other diabetic kidney complication: Secondary | ICD-10-CM | POA: Diagnosis not present

## 2017-10-06 DIAGNOSIS — E875 Hyperkalemia: Secondary | ICD-10-CM | POA: Diagnosis not present

## 2017-10-06 DIAGNOSIS — N186 End stage renal disease: Secondary | ICD-10-CM | POA: Diagnosis not present

## 2017-10-06 DIAGNOSIS — E1129 Type 2 diabetes mellitus with other diabetic kidney complication: Secondary | ICD-10-CM | POA: Diagnosis not present

## 2017-10-06 DIAGNOSIS — N2581 Secondary hyperparathyroidism of renal origin: Secondary | ICD-10-CM | POA: Diagnosis not present

## 2017-10-08 DIAGNOSIS — N2581 Secondary hyperparathyroidism of renal origin: Secondary | ICD-10-CM | POA: Diagnosis not present

## 2017-10-08 DIAGNOSIS — E875 Hyperkalemia: Secondary | ICD-10-CM | POA: Diagnosis not present

## 2017-10-08 DIAGNOSIS — E1129 Type 2 diabetes mellitus with other diabetic kidney complication: Secondary | ICD-10-CM | POA: Diagnosis not present

## 2017-10-08 DIAGNOSIS — N186 End stage renal disease: Secondary | ICD-10-CM | POA: Diagnosis not present

## 2017-10-10 DIAGNOSIS — E1129 Type 2 diabetes mellitus with other diabetic kidney complication: Secondary | ICD-10-CM | POA: Diagnosis not present

## 2017-10-10 DIAGNOSIS — N2581 Secondary hyperparathyroidism of renal origin: Secondary | ICD-10-CM | POA: Diagnosis not present

## 2017-10-10 DIAGNOSIS — N186 End stage renal disease: Secondary | ICD-10-CM | POA: Diagnosis not present

## 2017-10-10 DIAGNOSIS — E875 Hyperkalemia: Secondary | ICD-10-CM | POA: Diagnosis not present

## 2017-10-13 DIAGNOSIS — N2581 Secondary hyperparathyroidism of renal origin: Secondary | ICD-10-CM | POA: Diagnosis not present

## 2017-10-13 DIAGNOSIS — E1129 Type 2 diabetes mellitus with other diabetic kidney complication: Secondary | ICD-10-CM | POA: Diagnosis not present

## 2017-10-13 DIAGNOSIS — N186 End stage renal disease: Secondary | ICD-10-CM | POA: Diagnosis not present

## 2017-10-13 DIAGNOSIS — E875 Hyperkalemia: Secondary | ICD-10-CM | POA: Diagnosis not present

## 2017-10-14 DIAGNOSIS — N186 End stage renal disease: Secondary | ICD-10-CM | POA: Diagnosis not present

## 2017-10-14 DIAGNOSIS — I871 Compression of vein: Secondary | ICD-10-CM | POA: Diagnosis not present

## 2017-10-14 DIAGNOSIS — Z992 Dependence on renal dialysis: Secondary | ICD-10-CM | POA: Diagnosis not present

## 2017-10-14 DIAGNOSIS — T82858A Stenosis of vascular prosthetic devices, implants and grafts, initial encounter: Secondary | ICD-10-CM | POA: Diagnosis not present

## 2017-10-15 DIAGNOSIS — N186 End stage renal disease: Secondary | ICD-10-CM | POA: Diagnosis not present

## 2017-10-15 DIAGNOSIS — Z992 Dependence on renal dialysis: Secondary | ICD-10-CM | POA: Diagnosis not present

## 2017-10-15 DIAGNOSIS — N2581 Secondary hyperparathyroidism of renal origin: Secondary | ICD-10-CM | POA: Diagnosis not present

## 2017-10-15 DIAGNOSIS — E1129 Type 2 diabetes mellitus with other diabetic kidney complication: Secondary | ICD-10-CM | POA: Diagnosis not present

## 2017-10-15 DIAGNOSIS — E875 Hyperkalemia: Secondary | ICD-10-CM | POA: Diagnosis not present

## 2017-10-15 DIAGNOSIS — I158 Other secondary hypertension: Secondary | ICD-10-CM | POA: Diagnosis not present

## 2017-10-17 DIAGNOSIS — N186 End stage renal disease: Secondary | ICD-10-CM | POA: Diagnosis not present

## 2017-10-17 DIAGNOSIS — N2581 Secondary hyperparathyroidism of renal origin: Secondary | ICD-10-CM | POA: Diagnosis not present

## 2017-10-17 DIAGNOSIS — E875 Hyperkalemia: Secondary | ICD-10-CM | POA: Diagnosis not present

## 2017-10-17 DIAGNOSIS — E1129 Type 2 diabetes mellitus with other diabetic kidney complication: Secondary | ICD-10-CM | POA: Diagnosis not present

## 2017-10-20 DIAGNOSIS — E875 Hyperkalemia: Secondary | ICD-10-CM | POA: Diagnosis not present

## 2017-10-20 DIAGNOSIS — N2581 Secondary hyperparathyroidism of renal origin: Secondary | ICD-10-CM | POA: Diagnosis not present

## 2017-10-20 DIAGNOSIS — N186 End stage renal disease: Secondary | ICD-10-CM | POA: Diagnosis not present

## 2017-10-20 DIAGNOSIS — E1129 Type 2 diabetes mellitus with other diabetic kidney complication: Secondary | ICD-10-CM | POA: Diagnosis not present

## 2017-10-22 DIAGNOSIS — E1129 Type 2 diabetes mellitus with other diabetic kidney complication: Secondary | ICD-10-CM | POA: Diagnosis not present

## 2017-10-22 DIAGNOSIS — N2581 Secondary hyperparathyroidism of renal origin: Secondary | ICD-10-CM | POA: Diagnosis not present

## 2017-10-22 DIAGNOSIS — N186 End stage renal disease: Secondary | ICD-10-CM | POA: Diagnosis not present

## 2017-10-22 DIAGNOSIS — E875 Hyperkalemia: Secondary | ICD-10-CM | POA: Diagnosis not present

## 2017-10-24 DIAGNOSIS — E875 Hyperkalemia: Secondary | ICD-10-CM | POA: Diagnosis not present

## 2017-10-24 DIAGNOSIS — N186 End stage renal disease: Secondary | ICD-10-CM | POA: Diagnosis not present

## 2017-10-24 DIAGNOSIS — N2581 Secondary hyperparathyroidism of renal origin: Secondary | ICD-10-CM | POA: Diagnosis not present

## 2017-10-24 DIAGNOSIS — E1129 Type 2 diabetes mellitus with other diabetic kidney complication: Secondary | ICD-10-CM | POA: Diagnosis not present

## 2017-10-27 DIAGNOSIS — N186 End stage renal disease: Secondary | ICD-10-CM | POA: Diagnosis not present

## 2017-10-27 DIAGNOSIS — N2581 Secondary hyperparathyroidism of renal origin: Secondary | ICD-10-CM | POA: Diagnosis not present

## 2017-10-27 DIAGNOSIS — E1129 Type 2 diabetes mellitus with other diabetic kidney complication: Secondary | ICD-10-CM | POA: Diagnosis not present

## 2017-10-27 DIAGNOSIS — E875 Hyperkalemia: Secondary | ICD-10-CM | POA: Diagnosis not present

## 2017-10-29 DIAGNOSIS — N2581 Secondary hyperparathyroidism of renal origin: Secondary | ICD-10-CM | POA: Diagnosis not present

## 2017-10-29 DIAGNOSIS — N186 End stage renal disease: Secondary | ICD-10-CM | POA: Diagnosis not present

## 2017-10-29 DIAGNOSIS — E875 Hyperkalemia: Secondary | ICD-10-CM | POA: Diagnosis not present

## 2017-10-29 DIAGNOSIS — E1129 Type 2 diabetes mellitus with other diabetic kidney complication: Secondary | ICD-10-CM | POA: Diagnosis not present

## 2017-10-31 DIAGNOSIS — N2581 Secondary hyperparathyroidism of renal origin: Secondary | ICD-10-CM | POA: Diagnosis not present

## 2017-10-31 DIAGNOSIS — N186 End stage renal disease: Secondary | ICD-10-CM | POA: Diagnosis not present

## 2017-10-31 DIAGNOSIS — E1129 Type 2 diabetes mellitus with other diabetic kidney complication: Secondary | ICD-10-CM | POA: Diagnosis not present

## 2017-10-31 DIAGNOSIS — E875 Hyperkalemia: Secondary | ICD-10-CM | POA: Diagnosis not present

## 2017-11-02 DIAGNOSIS — N2581 Secondary hyperparathyroidism of renal origin: Secondary | ICD-10-CM | POA: Diagnosis not present

## 2017-11-02 DIAGNOSIS — E1129 Type 2 diabetes mellitus with other diabetic kidney complication: Secondary | ICD-10-CM | POA: Diagnosis not present

## 2017-11-02 DIAGNOSIS — N186 End stage renal disease: Secondary | ICD-10-CM | POA: Diagnosis not present

## 2017-11-02 DIAGNOSIS — E875 Hyperkalemia: Secondary | ICD-10-CM | POA: Diagnosis not present

## 2017-11-04 DIAGNOSIS — N2581 Secondary hyperparathyroidism of renal origin: Secondary | ICD-10-CM | POA: Diagnosis not present

## 2017-11-04 DIAGNOSIS — N186 End stage renal disease: Secondary | ICD-10-CM | POA: Diagnosis not present

## 2017-11-04 DIAGNOSIS — E875 Hyperkalemia: Secondary | ICD-10-CM | POA: Diagnosis not present

## 2017-11-04 DIAGNOSIS — E1129 Type 2 diabetes mellitus with other diabetic kidney complication: Secondary | ICD-10-CM | POA: Diagnosis not present

## 2017-11-07 DIAGNOSIS — N186 End stage renal disease: Secondary | ICD-10-CM | POA: Diagnosis not present

## 2017-11-07 DIAGNOSIS — N2581 Secondary hyperparathyroidism of renal origin: Secondary | ICD-10-CM | POA: Diagnosis not present

## 2017-11-07 DIAGNOSIS — E1129 Type 2 diabetes mellitus with other diabetic kidney complication: Secondary | ICD-10-CM | POA: Diagnosis not present

## 2017-11-07 DIAGNOSIS — E875 Hyperkalemia: Secondary | ICD-10-CM | POA: Diagnosis not present

## 2017-11-10 DIAGNOSIS — N186 End stage renal disease: Secondary | ICD-10-CM | POA: Diagnosis not present

## 2017-11-10 DIAGNOSIS — E1129 Type 2 diabetes mellitus with other diabetic kidney complication: Secondary | ICD-10-CM | POA: Diagnosis not present

## 2017-11-10 DIAGNOSIS — N2581 Secondary hyperparathyroidism of renal origin: Secondary | ICD-10-CM | POA: Diagnosis not present

## 2017-11-10 DIAGNOSIS — E875 Hyperkalemia: Secondary | ICD-10-CM | POA: Diagnosis not present

## 2017-11-11 DIAGNOSIS — I120 Hypertensive chronic kidney disease with stage 5 chronic kidney disease or end stage renal disease: Secondary | ICD-10-CM | POA: Diagnosis not present

## 2017-11-11 DIAGNOSIS — E11649 Type 2 diabetes mellitus with hypoglycemia without coma: Secondary | ICD-10-CM | POA: Diagnosis not present

## 2017-11-12 DIAGNOSIS — N186 End stage renal disease: Secondary | ICD-10-CM | POA: Diagnosis not present

## 2017-11-12 DIAGNOSIS — E875 Hyperkalemia: Secondary | ICD-10-CM | POA: Diagnosis not present

## 2017-11-12 DIAGNOSIS — N2581 Secondary hyperparathyroidism of renal origin: Secondary | ICD-10-CM | POA: Diagnosis not present

## 2017-11-12 DIAGNOSIS — E1129 Type 2 diabetes mellitus with other diabetic kidney complication: Secondary | ICD-10-CM | POA: Diagnosis not present

## 2017-11-14 DIAGNOSIS — Z992 Dependence on renal dialysis: Secondary | ICD-10-CM | POA: Diagnosis not present

## 2017-11-14 DIAGNOSIS — N2581 Secondary hyperparathyroidism of renal origin: Secondary | ICD-10-CM | POA: Diagnosis not present

## 2017-11-14 DIAGNOSIS — E1129 Type 2 diabetes mellitus with other diabetic kidney complication: Secondary | ICD-10-CM | POA: Diagnosis not present

## 2017-11-14 DIAGNOSIS — I158 Other secondary hypertension: Secondary | ICD-10-CM | POA: Diagnosis not present

## 2017-11-14 DIAGNOSIS — N186 End stage renal disease: Secondary | ICD-10-CM | POA: Diagnosis not present

## 2017-11-14 DIAGNOSIS — E875 Hyperkalemia: Secondary | ICD-10-CM | POA: Diagnosis not present

## 2017-11-17 DIAGNOSIS — E1129 Type 2 diabetes mellitus with other diabetic kidney complication: Secondary | ICD-10-CM | POA: Diagnosis not present

## 2017-11-17 DIAGNOSIS — N2581 Secondary hyperparathyroidism of renal origin: Secondary | ICD-10-CM | POA: Diagnosis not present

## 2017-11-17 DIAGNOSIS — E875 Hyperkalemia: Secondary | ICD-10-CM | POA: Diagnosis not present

## 2017-11-17 DIAGNOSIS — N186 End stage renal disease: Secondary | ICD-10-CM | POA: Diagnosis not present

## 2017-11-19 DIAGNOSIS — E1129 Type 2 diabetes mellitus with other diabetic kidney complication: Secondary | ICD-10-CM | POA: Diagnosis not present

## 2017-11-19 DIAGNOSIS — N2581 Secondary hyperparathyroidism of renal origin: Secondary | ICD-10-CM | POA: Diagnosis not present

## 2017-11-19 DIAGNOSIS — N186 End stage renal disease: Secondary | ICD-10-CM | POA: Diagnosis not present

## 2017-11-19 DIAGNOSIS — E875 Hyperkalemia: Secondary | ICD-10-CM | POA: Diagnosis not present

## 2017-11-21 DIAGNOSIS — E1129 Type 2 diabetes mellitus with other diabetic kidney complication: Secondary | ICD-10-CM | POA: Diagnosis not present

## 2017-11-21 DIAGNOSIS — E875 Hyperkalemia: Secondary | ICD-10-CM | POA: Diagnosis not present

## 2017-11-21 DIAGNOSIS — N186 End stage renal disease: Secondary | ICD-10-CM | POA: Diagnosis not present

## 2017-11-21 DIAGNOSIS — N2581 Secondary hyperparathyroidism of renal origin: Secondary | ICD-10-CM | POA: Diagnosis not present

## 2017-11-25 DIAGNOSIS — N186 End stage renal disease: Secondary | ICD-10-CM | POA: Diagnosis not present

## 2017-11-25 DIAGNOSIS — E1129 Type 2 diabetes mellitus with other diabetic kidney complication: Secondary | ICD-10-CM | POA: Diagnosis not present

## 2017-11-25 DIAGNOSIS — N2581 Secondary hyperparathyroidism of renal origin: Secondary | ICD-10-CM | POA: Diagnosis not present

## 2017-11-25 DIAGNOSIS — E875 Hyperkalemia: Secondary | ICD-10-CM | POA: Diagnosis not present

## 2017-11-26 DIAGNOSIS — E1129 Type 2 diabetes mellitus with other diabetic kidney complication: Secondary | ICD-10-CM | POA: Diagnosis not present

## 2017-11-26 DIAGNOSIS — N186 End stage renal disease: Secondary | ICD-10-CM | POA: Diagnosis not present

## 2017-11-26 DIAGNOSIS — E875 Hyperkalemia: Secondary | ICD-10-CM | POA: Diagnosis not present

## 2017-11-26 DIAGNOSIS — N2581 Secondary hyperparathyroidism of renal origin: Secondary | ICD-10-CM | POA: Diagnosis not present

## 2017-11-28 DIAGNOSIS — E1129 Type 2 diabetes mellitus with other diabetic kidney complication: Secondary | ICD-10-CM | POA: Diagnosis not present

## 2017-11-28 DIAGNOSIS — E875 Hyperkalemia: Secondary | ICD-10-CM | POA: Diagnosis not present

## 2017-11-28 DIAGNOSIS — N186 End stage renal disease: Secondary | ICD-10-CM | POA: Diagnosis not present

## 2017-11-28 DIAGNOSIS — N2581 Secondary hyperparathyroidism of renal origin: Secondary | ICD-10-CM | POA: Diagnosis not present

## 2017-12-01 DIAGNOSIS — E875 Hyperkalemia: Secondary | ICD-10-CM | POA: Diagnosis not present

## 2017-12-01 DIAGNOSIS — E1129 Type 2 diabetes mellitus with other diabetic kidney complication: Secondary | ICD-10-CM | POA: Diagnosis not present

## 2017-12-01 DIAGNOSIS — N186 End stage renal disease: Secondary | ICD-10-CM | POA: Diagnosis not present

## 2017-12-01 DIAGNOSIS — N2581 Secondary hyperparathyroidism of renal origin: Secondary | ICD-10-CM | POA: Diagnosis not present

## 2017-12-03 DIAGNOSIS — E1129 Type 2 diabetes mellitus with other diabetic kidney complication: Secondary | ICD-10-CM | POA: Diagnosis not present

## 2017-12-03 DIAGNOSIS — N2581 Secondary hyperparathyroidism of renal origin: Secondary | ICD-10-CM | POA: Diagnosis not present

## 2017-12-03 DIAGNOSIS — E875 Hyperkalemia: Secondary | ICD-10-CM | POA: Diagnosis not present

## 2017-12-03 DIAGNOSIS — N186 End stage renal disease: Secondary | ICD-10-CM | POA: Diagnosis not present

## 2017-12-05 DIAGNOSIS — E1129 Type 2 diabetes mellitus with other diabetic kidney complication: Secondary | ICD-10-CM | POA: Diagnosis not present

## 2017-12-05 DIAGNOSIS — N186 End stage renal disease: Secondary | ICD-10-CM | POA: Diagnosis not present

## 2017-12-05 DIAGNOSIS — E875 Hyperkalemia: Secondary | ICD-10-CM | POA: Diagnosis not present

## 2017-12-05 DIAGNOSIS — N2581 Secondary hyperparathyroidism of renal origin: Secondary | ICD-10-CM | POA: Diagnosis not present

## 2017-12-07 DIAGNOSIS — N2581 Secondary hyperparathyroidism of renal origin: Secondary | ICD-10-CM | POA: Diagnosis not present

## 2017-12-07 DIAGNOSIS — N186 End stage renal disease: Secondary | ICD-10-CM | POA: Diagnosis not present

## 2017-12-07 DIAGNOSIS — E1129 Type 2 diabetes mellitus with other diabetic kidney complication: Secondary | ICD-10-CM | POA: Diagnosis not present

## 2017-12-07 DIAGNOSIS — E875 Hyperkalemia: Secondary | ICD-10-CM | POA: Diagnosis not present

## 2017-12-11 DIAGNOSIS — N2581 Secondary hyperparathyroidism of renal origin: Secondary | ICD-10-CM | POA: Diagnosis not present

## 2017-12-11 DIAGNOSIS — N186 End stage renal disease: Secondary | ICD-10-CM | POA: Diagnosis not present

## 2017-12-11 DIAGNOSIS — E875 Hyperkalemia: Secondary | ICD-10-CM | POA: Diagnosis not present

## 2017-12-11 DIAGNOSIS — E1129 Type 2 diabetes mellitus with other diabetic kidney complication: Secondary | ICD-10-CM | POA: Diagnosis not present

## 2017-12-12 DIAGNOSIS — E1129 Type 2 diabetes mellitus with other diabetic kidney complication: Secondary | ICD-10-CM | POA: Diagnosis not present

## 2017-12-12 DIAGNOSIS — N186 End stage renal disease: Secondary | ICD-10-CM | POA: Diagnosis not present

## 2017-12-12 DIAGNOSIS — N2581 Secondary hyperparathyroidism of renal origin: Secondary | ICD-10-CM | POA: Diagnosis not present

## 2017-12-12 DIAGNOSIS — E875 Hyperkalemia: Secondary | ICD-10-CM | POA: Diagnosis not present

## 2017-12-14 DIAGNOSIS — E1129 Type 2 diabetes mellitus with other diabetic kidney complication: Secondary | ICD-10-CM | POA: Diagnosis not present

## 2017-12-14 DIAGNOSIS — E875 Hyperkalemia: Secondary | ICD-10-CM | POA: Diagnosis not present

## 2017-12-14 DIAGNOSIS — N186 End stage renal disease: Secondary | ICD-10-CM | POA: Diagnosis not present

## 2017-12-14 DIAGNOSIS — N2581 Secondary hyperparathyroidism of renal origin: Secondary | ICD-10-CM | POA: Diagnosis not present

## 2017-12-15 DIAGNOSIS — N186 End stage renal disease: Secondary | ICD-10-CM | POA: Diagnosis not present

## 2017-12-15 DIAGNOSIS — I158 Other secondary hypertension: Secondary | ICD-10-CM | POA: Diagnosis not present

## 2017-12-15 DIAGNOSIS — Z992 Dependence on renal dialysis: Secondary | ICD-10-CM | POA: Diagnosis not present

## 2017-12-17 DIAGNOSIS — E875 Hyperkalemia: Secondary | ICD-10-CM | POA: Diagnosis not present

## 2017-12-17 DIAGNOSIS — N2581 Secondary hyperparathyroidism of renal origin: Secondary | ICD-10-CM | POA: Diagnosis not present

## 2017-12-17 DIAGNOSIS — N186 End stage renal disease: Secondary | ICD-10-CM | POA: Diagnosis not present

## 2017-12-17 DIAGNOSIS — D631 Anemia in chronic kidney disease: Secondary | ICD-10-CM | POA: Diagnosis not present

## 2017-12-17 DIAGNOSIS — E1129 Type 2 diabetes mellitus with other diabetic kidney complication: Secondary | ICD-10-CM | POA: Diagnosis not present

## 2017-12-17 DIAGNOSIS — D509 Iron deficiency anemia, unspecified: Secondary | ICD-10-CM | POA: Diagnosis not present

## 2017-12-18 DIAGNOSIS — J441 Chronic obstructive pulmonary disease with (acute) exacerbation: Secondary | ICD-10-CM | POA: Diagnosis not present

## 2017-12-18 DIAGNOSIS — I120 Hypertensive chronic kidney disease with stage 5 chronic kidney disease or end stage renal disease: Secondary | ICD-10-CM | POA: Diagnosis not present

## 2017-12-18 DIAGNOSIS — I10 Essential (primary) hypertension: Secondary | ICD-10-CM | POA: Diagnosis not present

## 2017-12-18 DIAGNOSIS — E0822 Diabetes mellitus due to underlying condition with diabetic chronic kidney disease: Secondary | ICD-10-CM | POA: Diagnosis not present

## 2017-12-18 DIAGNOSIS — Z992 Dependence on renal dialysis: Secondary | ICD-10-CM | POA: Diagnosis not present

## 2017-12-19 DIAGNOSIS — D631 Anemia in chronic kidney disease: Secondary | ICD-10-CM | POA: Diagnosis not present

## 2017-12-19 DIAGNOSIS — N2581 Secondary hyperparathyroidism of renal origin: Secondary | ICD-10-CM | POA: Diagnosis not present

## 2017-12-19 DIAGNOSIS — E1129 Type 2 diabetes mellitus with other diabetic kidney complication: Secondary | ICD-10-CM | POA: Diagnosis not present

## 2017-12-19 DIAGNOSIS — D509 Iron deficiency anemia, unspecified: Secondary | ICD-10-CM | POA: Diagnosis not present

## 2017-12-19 DIAGNOSIS — N186 End stage renal disease: Secondary | ICD-10-CM | POA: Diagnosis not present

## 2017-12-19 DIAGNOSIS — E875 Hyperkalemia: Secondary | ICD-10-CM | POA: Diagnosis not present

## 2017-12-22 DIAGNOSIS — D631 Anemia in chronic kidney disease: Secondary | ICD-10-CM | POA: Diagnosis not present

## 2017-12-22 DIAGNOSIS — N186 End stage renal disease: Secondary | ICD-10-CM | POA: Diagnosis not present

## 2017-12-22 DIAGNOSIS — N2581 Secondary hyperparathyroidism of renal origin: Secondary | ICD-10-CM | POA: Diagnosis not present

## 2017-12-22 DIAGNOSIS — E875 Hyperkalemia: Secondary | ICD-10-CM | POA: Diagnosis not present

## 2017-12-22 DIAGNOSIS — D509 Iron deficiency anemia, unspecified: Secondary | ICD-10-CM | POA: Diagnosis not present

## 2017-12-22 DIAGNOSIS — E1129 Type 2 diabetes mellitus with other diabetic kidney complication: Secondary | ICD-10-CM | POA: Diagnosis not present

## 2017-12-24 DIAGNOSIS — D631 Anemia in chronic kidney disease: Secondary | ICD-10-CM | POA: Diagnosis not present

## 2017-12-24 DIAGNOSIS — E875 Hyperkalemia: Secondary | ICD-10-CM | POA: Diagnosis not present

## 2017-12-24 DIAGNOSIS — D509 Iron deficiency anemia, unspecified: Secondary | ICD-10-CM | POA: Diagnosis not present

## 2017-12-24 DIAGNOSIS — N2581 Secondary hyperparathyroidism of renal origin: Secondary | ICD-10-CM | POA: Diagnosis not present

## 2017-12-24 DIAGNOSIS — E1129 Type 2 diabetes mellitus with other diabetic kidney complication: Secondary | ICD-10-CM | POA: Diagnosis not present

## 2017-12-24 DIAGNOSIS — N186 End stage renal disease: Secondary | ICD-10-CM | POA: Diagnosis not present

## 2017-12-26 DIAGNOSIS — N186 End stage renal disease: Secondary | ICD-10-CM | POA: Diagnosis not present

## 2017-12-26 DIAGNOSIS — E1129 Type 2 diabetes mellitus with other diabetic kidney complication: Secondary | ICD-10-CM | POA: Diagnosis not present

## 2017-12-26 DIAGNOSIS — D509 Iron deficiency anemia, unspecified: Secondary | ICD-10-CM | POA: Diagnosis not present

## 2017-12-26 DIAGNOSIS — D631 Anemia in chronic kidney disease: Secondary | ICD-10-CM | POA: Diagnosis not present

## 2017-12-26 DIAGNOSIS — E875 Hyperkalemia: Secondary | ICD-10-CM | POA: Diagnosis not present

## 2017-12-26 DIAGNOSIS — N2581 Secondary hyperparathyroidism of renal origin: Secondary | ICD-10-CM | POA: Diagnosis not present

## 2017-12-29 DIAGNOSIS — E875 Hyperkalemia: Secondary | ICD-10-CM | POA: Diagnosis not present

## 2017-12-29 DIAGNOSIS — D631 Anemia in chronic kidney disease: Secondary | ICD-10-CM | POA: Diagnosis not present

## 2017-12-29 DIAGNOSIS — N2581 Secondary hyperparathyroidism of renal origin: Secondary | ICD-10-CM | POA: Diagnosis not present

## 2017-12-29 DIAGNOSIS — D509 Iron deficiency anemia, unspecified: Secondary | ICD-10-CM | POA: Diagnosis not present

## 2017-12-29 DIAGNOSIS — E1129 Type 2 diabetes mellitus with other diabetic kidney complication: Secondary | ICD-10-CM | POA: Diagnosis not present

## 2017-12-29 DIAGNOSIS — N186 End stage renal disease: Secondary | ICD-10-CM | POA: Diagnosis not present

## 2017-12-31 DIAGNOSIS — N186 End stage renal disease: Secondary | ICD-10-CM | POA: Diagnosis not present

## 2017-12-31 DIAGNOSIS — E875 Hyperkalemia: Secondary | ICD-10-CM | POA: Diagnosis not present

## 2017-12-31 DIAGNOSIS — D509 Iron deficiency anemia, unspecified: Secondary | ICD-10-CM | POA: Diagnosis not present

## 2017-12-31 DIAGNOSIS — N2581 Secondary hyperparathyroidism of renal origin: Secondary | ICD-10-CM | POA: Diagnosis not present

## 2017-12-31 DIAGNOSIS — D631 Anemia in chronic kidney disease: Secondary | ICD-10-CM | POA: Diagnosis not present

## 2017-12-31 DIAGNOSIS — E1129 Type 2 diabetes mellitus with other diabetic kidney complication: Secondary | ICD-10-CM | POA: Diagnosis not present

## 2018-01-02 DIAGNOSIS — E1129 Type 2 diabetes mellitus with other diabetic kidney complication: Secondary | ICD-10-CM | POA: Diagnosis not present

## 2018-01-02 DIAGNOSIS — N2581 Secondary hyperparathyroidism of renal origin: Secondary | ICD-10-CM | POA: Diagnosis not present

## 2018-01-02 DIAGNOSIS — D631 Anemia in chronic kidney disease: Secondary | ICD-10-CM | POA: Diagnosis not present

## 2018-01-02 DIAGNOSIS — N186 End stage renal disease: Secondary | ICD-10-CM | POA: Diagnosis not present

## 2018-01-02 DIAGNOSIS — E875 Hyperkalemia: Secondary | ICD-10-CM | POA: Diagnosis not present

## 2018-01-02 DIAGNOSIS — D509 Iron deficiency anemia, unspecified: Secondary | ICD-10-CM | POA: Diagnosis not present

## 2018-01-05 DIAGNOSIS — D631 Anemia in chronic kidney disease: Secondary | ICD-10-CM | POA: Diagnosis not present

## 2018-01-05 DIAGNOSIS — D509 Iron deficiency anemia, unspecified: Secondary | ICD-10-CM | POA: Diagnosis not present

## 2018-01-05 DIAGNOSIS — N186 End stage renal disease: Secondary | ICD-10-CM | POA: Diagnosis not present

## 2018-01-05 DIAGNOSIS — E1129 Type 2 diabetes mellitus with other diabetic kidney complication: Secondary | ICD-10-CM | POA: Diagnosis not present

## 2018-01-05 DIAGNOSIS — N2581 Secondary hyperparathyroidism of renal origin: Secondary | ICD-10-CM | POA: Diagnosis not present

## 2018-01-05 DIAGNOSIS — E875 Hyperkalemia: Secondary | ICD-10-CM | POA: Diagnosis not present

## 2018-01-07 DIAGNOSIS — N2581 Secondary hyperparathyroidism of renal origin: Secondary | ICD-10-CM | POA: Diagnosis not present

## 2018-01-07 DIAGNOSIS — D509 Iron deficiency anemia, unspecified: Secondary | ICD-10-CM | POA: Diagnosis not present

## 2018-01-07 DIAGNOSIS — E875 Hyperkalemia: Secondary | ICD-10-CM | POA: Diagnosis not present

## 2018-01-07 DIAGNOSIS — D631 Anemia in chronic kidney disease: Secondary | ICD-10-CM | POA: Diagnosis not present

## 2018-01-07 DIAGNOSIS — N186 End stage renal disease: Secondary | ICD-10-CM | POA: Diagnosis not present

## 2018-01-07 DIAGNOSIS — E1129 Type 2 diabetes mellitus with other diabetic kidney complication: Secondary | ICD-10-CM | POA: Diagnosis not present

## 2018-01-09 DIAGNOSIS — D631 Anemia in chronic kidney disease: Secondary | ICD-10-CM | POA: Diagnosis not present

## 2018-01-09 DIAGNOSIS — N2581 Secondary hyperparathyroidism of renal origin: Secondary | ICD-10-CM | POA: Diagnosis not present

## 2018-01-09 DIAGNOSIS — N186 End stage renal disease: Secondary | ICD-10-CM | POA: Diagnosis not present

## 2018-01-09 DIAGNOSIS — E1129 Type 2 diabetes mellitus with other diabetic kidney complication: Secondary | ICD-10-CM | POA: Diagnosis not present

## 2018-01-09 DIAGNOSIS — E875 Hyperkalemia: Secondary | ICD-10-CM | POA: Diagnosis not present

## 2018-01-09 DIAGNOSIS — D509 Iron deficiency anemia, unspecified: Secondary | ICD-10-CM | POA: Diagnosis not present

## 2018-01-12 DIAGNOSIS — D631 Anemia in chronic kidney disease: Secondary | ICD-10-CM | POA: Diagnosis not present

## 2018-01-12 DIAGNOSIS — E1129 Type 2 diabetes mellitus with other diabetic kidney complication: Secondary | ICD-10-CM | POA: Diagnosis not present

## 2018-01-12 DIAGNOSIS — N186 End stage renal disease: Secondary | ICD-10-CM | POA: Diagnosis not present

## 2018-01-12 DIAGNOSIS — E875 Hyperkalemia: Secondary | ICD-10-CM | POA: Diagnosis not present

## 2018-01-12 DIAGNOSIS — N2581 Secondary hyperparathyroidism of renal origin: Secondary | ICD-10-CM | POA: Diagnosis not present

## 2018-01-12 DIAGNOSIS — D509 Iron deficiency anemia, unspecified: Secondary | ICD-10-CM | POA: Diagnosis not present

## 2018-01-13 DIAGNOSIS — T82858A Stenosis of vascular prosthetic devices, implants and grafts, initial encounter: Secondary | ICD-10-CM | POA: Diagnosis not present

## 2018-01-13 DIAGNOSIS — Z992 Dependence on renal dialysis: Secondary | ICD-10-CM | POA: Diagnosis not present

## 2018-01-13 DIAGNOSIS — N186 End stage renal disease: Secondary | ICD-10-CM | POA: Diagnosis not present

## 2018-01-13 DIAGNOSIS — I871 Compression of vein: Secondary | ICD-10-CM | POA: Diagnosis not present

## 2018-01-14 DIAGNOSIS — E1129 Type 2 diabetes mellitus with other diabetic kidney complication: Secondary | ICD-10-CM | POA: Diagnosis not present

## 2018-01-14 DIAGNOSIS — N186 End stage renal disease: Secondary | ICD-10-CM | POA: Diagnosis not present

## 2018-01-14 DIAGNOSIS — E875 Hyperkalemia: Secondary | ICD-10-CM | POA: Diagnosis not present

## 2018-01-14 DIAGNOSIS — D631 Anemia in chronic kidney disease: Secondary | ICD-10-CM | POA: Diagnosis not present

## 2018-01-14 DIAGNOSIS — N2581 Secondary hyperparathyroidism of renal origin: Secondary | ICD-10-CM | POA: Diagnosis not present

## 2018-01-14 DIAGNOSIS — D509 Iron deficiency anemia, unspecified: Secondary | ICD-10-CM | POA: Diagnosis not present

## 2018-01-15 DIAGNOSIS — I158 Other secondary hypertension: Secondary | ICD-10-CM | POA: Diagnosis not present

## 2018-01-15 DIAGNOSIS — Z992 Dependence on renal dialysis: Secondary | ICD-10-CM | POA: Diagnosis not present

## 2018-01-15 DIAGNOSIS — N186 End stage renal disease: Secondary | ICD-10-CM | POA: Diagnosis not present

## 2018-01-16 DIAGNOSIS — Z992 Dependence on renal dialysis: Secondary | ICD-10-CM | POA: Diagnosis not present

## 2018-01-16 DIAGNOSIS — E1129 Type 2 diabetes mellitus with other diabetic kidney complication: Secondary | ICD-10-CM | POA: Diagnosis not present

## 2018-01-16 DIAGNOSIS — D509 Iron deficiency anemia, unspecified: Secondary | ICD-10-CM | POA: Diagnosis not present

## 2018-01-16 DIAGNOSIS — N2581 Secondary hyperparathyroidism of renal origin: Secondary | ICD-10-CM | POA: Diagnosis not present

## 2018-01-16 DIAGNOSIS — E875 Hyperkalemia: Secondary | ICD-10-CM | POA: Diagnosis not present

## 2018-01-16 DIAGNOSIS — N186 End stage renal disease: Secondary | ICD-10-CM | POA: Diagnosis not present

## 2018-01-16 DIAGNOSIS — I158 Other secondary hypertension: Secondary | ICD-10-CM | POA: Diagnosis not present

## 2018-01-19 DIAGNOSIS — N2581 Secondary hyperparathyroidism of renal origin: Secondary | ICD-10-CM | POA: Diagnosis not present

## 2018-01-19 DIAGNOSIS — E875 Hyperkalemia: Secondary | ICD-10-CM | POA: Diagnosis not present

## 2018-01-19 DIAGNOSIS — D509 Iron deficiency anemia, unspecified: Secondary | ICD-10-CM | POA: Diagnosis not present

## 2018-01-19 DIAGNOSIS — N186 End stage renal disease: Secondary | ICD-10-CM | POA: Diagnosis not present

## 2018-01-19 DIAGNOSIS — E1129 Type 2 diabetes mellitus with other diabetic kidney complication: Secondary | ICD-10-CM | POA: Diagnosis not present

## 2018-01-21 DIAGNOSIS — N2581 Secondary hyperparathyroidism of renal origin: Secondary | ICD-10-CM | POA: Diagnosis not present

## 2018-01-21 DIAGNOSIS — E875 Hyperkalemia: Secondary | ICD-10-CM | POA: Diagnosis not present

## 2018-01-21 DIAGNOSIS — E1129 Type 2 diabetes mellitus with other diabetic kidney complication: Secondary | ICD-10-CM | POA: Diagnosis not present

## 2018-01-21 DIAGNOSIS — D509 Iron deficiency anemia, unspecified: Secondary | ICD-10-CM | POA: Diagnosis not present

## 2018-01-21 DIAGNOSIS — N186 End stage renal disease: Secondary | ICD-10-CM | POA: Diagnosis not present

## 2018-01-23 DIAGNOSIS — E1129 Type 2 diabetes mellitus with other diabetic kidney complication: Secondary | ICD-10-CM | POA: Diagnosis not present

## 2018-01-23 DIAGNOSIS — E875 Hyperkalemia: Secondary | ICD-10-CM | POA: Diagnosis not present

## 2018-01-23 DIAGNOSIS — D509 Iron deficiency anemia, unspecified: Secondary | ICD-10-CM | POA: Diagnosis not present

## 2018-01-23 DIAGNOSIS — N2581 Secondary hyperparathyroidism of renal origin: Secondary | ICD-10-CM | POA: Diagnosis not present

## 2018-01-23 DIAGNOSIS — N186 End stage renal disease: Secondary | ICD-10-CM | POA: Diagnosis not present

## 2018-01-26 DIAGNOSIS — D509 Iron deficiency anemia, unspecified: Secondary | ICD-10-CM | POA: Diagnosis not present

## 2018-01-26 DIAGNOSIS — N186 End stage renal disease: Secondary | ICD-10-CM | POA: Diagnosis not present

## 2018-01-26 DIAGNOSIS — E875 Hyperkalemia: Secondary | ICD-10-CM | POA: Diagnosis not present

## 2018-01-26 DIAGNOSIS — E1129 Type 2 diabetes mellitus with other diabetic kidney complication: Secondary | ICD-10-CM | POA: Diagnosis not present

## 2018-01-26 DIAGNOSIS — N2581 Secondary hyperparathyroidism of renal origin: Secondary | ICD-10-CM | POA: Diagnosis not present

## 2018-01-28 DIAGNOSIS — N186 End stage renal disease: Secondary | ICD-10-CM | POA: Diagnosis not present

## 2018-01-28 DIAGNOSIS — E1129 Type 2 diabetes mellitus with other diabetic kidney complication: Secondary | ICD-10-CM | POA: Diagnosis not present

## 2018-01-28 DIAGNOSIS — N2581 Secondary hyperparathyroidism of renal origin: Secondary | ICD-10-CM | POA: Diagnosis not present

## 2018-01-28 DIAGNOSIS — D509 Iron deficiency anemia, unspecified: Secondary | ICD-10-CM | POA: Diagnosis not present

## 2018-01-28 DIAGNOSIS — E875 Hyperkalemia: Secondary | ICD-10-CM | POA: Diagnosis not present

## 2018-01-29 DIAGNOSIS — E0822 Diabetes mellitus due to underlying condition with diabetic chronic kidney disease: Secondary | ICD-10-CM | POA: Diagnosis not present

## 2018-01-29 DIAGNOSIS — I120 Hypertensive chronic kidney disease with stage 5 chronic kidney disease or end stage renal disease: Secondary | ICD-10-CM | POA: Diagnosis not present

## 2018-01-30 DIAGNOSIS — D509 Iron deficiency anemia, unspecified: Secondary | ICD-10-CM | POA: Diagnosis not present

## 2018-01-30 DIAGNOSIS — E1129 Type 2 diabetes mellitus with other diabetic kidney complication: Secondary | ICD-10-CM | POA: Diagnosis not present

## 2018-01-30 DIAGNOSIS — N186 End stage renal disease: Secondary | ICD-10-CM | POA: Diagnosis not present

## 2018-01-30 DIAGNOSIS — N2581 Secondary hyperparathyroidism of renal origin: Secondary | ICD-10-CM | POA: Diagnosis not present

## 2018-01-30 DIAGNOSIS — E875 Hyperkalemia: Secondary | ICD-10-CM | POA: Diagnosis not present

## 2018-02-02 DIAGNOSIS — N186 End stage renal disease: Secondary | ICD-10-CM | POA: Diagnosis not present

## 2018-02-02 DIAGNOSIS — E875 Hyperkalemia: Secondary | ICD-10-CM | POA: Diagnosis not present

## 2018-02-02 DIAGNOSIS — D509 Iron deficiency anemia, unspecified: Secondary | ICD-10-CM | POA: Diagnosis not present

## 2018-02-02 DIAGNOSIS — E1129 Type 2 diabetes mellitus with other diabetic kidney complication: Secondary | ICD-10-CM | POA: Diagnosis not present

## 2018-02-02 DIAGNOSIS — N2581 Secondary hyperparathyroidism of renal origin: Secondary | ICD-10-CM | POA: Diagnosis not present

## 2018-02-04 DIAGNOSIS — E875 Hyperkalemia: Secondary | ICD-10-CM | POA: Diagnosis not present

## 2018-02-04 DIAGNOSIS — D509 Iron deficiency anemia, unspecified: Secondary | ICD-10-CM | POA: Diagnosis not present

## 2018-02-04 DIAGNOSIS — N186 End stage renal disease: Secondary | ICD-10-CM | POA: Diagnosis not present

## 2018-02-04 DIAGNOSIS — E1129 Type 2 diabetes mellitus with other diabetic kidney complication: Secondary | ICD-10-CM | POA: Diagnosis not present

## 2018-02-04 DIAGNOSIS — N2581 Secondary hyperparathyroidism of renal origin: Secondary | ICD-10-CM | POA: Diagnosis not present

## 2018-02-06 DIAGNOSIS — E875 Hyperkalemia: Secondary | ICD-10-CM | POA: Diagnosis not present

## 2018-02-06 DIAGNOSIS — N186 End stage renal disease: Secondary | ICD-10-CM | POA: Diagnosis not present

## 2018-02-06 DIAGNOSIS — N2581 Secondary hyperparathyroidism of renal origin: Secondary | ICD-10-CM | POA: Diagnosis not present

## 2018-02-06 DIAGNOSIS — E1129 Type 2 diabetes mellitus with other diabetic kidney complication: Secondary | ICD-10-CM | POA: Diagnosis not present

## 2018-02-06 DIAGNOSIS — D509 Iron deficiency anemia, unspecified: Secondary | ICD-10-CM | POA: Diagnosis not present

## 2018-02-09 DIAGNOSIS — D509 Iron deficiency anemia, unspecified: Secondary | ICD-10-CM | POA: Diagnosis not present

## 2018-02-09 DIAGNOSIS — N186 End stage renal disease: Secondary | ICD-10-CM | POA: Diagnosis not present

## 2018-02-09 DIAGNOSIS — E1129 Type 2 diabetes mellitus with other diabetic kidney complication: Secondary | ICD-10-CM | POA: Diagnosis not present

## 2018-02-09 DIAGNOSIS — E875 Hyperkalemia: Secondary | ICD-10-CM | POA: Diagnosis not present

## 2018-02-09 DIAGNOSIS — N2581 Secondary hyperparathyroidism of renal origin: Secondary | ICD-10-CM | POA: Diagnosis not present

## 2018-02-10 DIAGNOSIS — Z992 Dependence on renal dialysis: Secondary | ICD-10-CM | POA: Diagnosis not present

## 2018-02-10 DIAGNOSIS — T82858A Stenosis of vascular prosthetic devices, implants and grafts, initial encounter: Secondary | ICD-10-CM | POA: Diagnosis not present

## 2018-02-10 DIAGNOSIS — N186 End stage renal disease: Secondary | ICD-10-CM | POA: Diagnosis not present

## 2018-02-10 DIAGNOSIS — I871 Compression of vein: Secondary | ICD-10-CM | POA: Diagnosis not present

## 2018-02-11 DIAGNOSIS — N186 End stage renal disease: Secondary | ICD-10-CM | POA: Diagnosis not present

## 2018-02-11 DIAGNOSIS — E1129 Type 2 diabetes mellitus with other diabetic kidney complication: Secondary | ICD-10-CM | POA: Diagnosis not present

## 2018-02-11 DIAGNOSIS — D509 Iron deficiency anemia, unspecified: Secondary | ICD-10-CM | POA: Diagnosis not present

## 2018-02-11 DIAGNOSIS — E875 Hyperkalemia: Secondary | ICD-10-CM | POA: Diagnosis not present

## 2018-02-11 DIAGNOSIS — N2581 Secondary hyperparathyroidism of renal origin: Secondary | ICD-10-CM | POA: Diagnosis not present

## 2018-02-13 DIAGNOSIS — D509 Iron deficiency anemia, unspecified: Secondary | ICD-10-CM | POA: Diagnosis not present

## 2018-02-13 DIAGNOSIS — Z992 Dependence on renal dialysis: Secondary | ICD-10-CM | POA: Diagnosis not present

## 2018-02-13 DIAGNOSIS — I158 Other secondary hypertension: Secondary | ICD-10-CM | POA: Diagnosis not present

## 2018-02-13 DIAGNOSIS — N186 End stage renal disease: Secondary | ICD-10-CM | POA: Diagnosis not present

## 2018-02-13 DIAGNOSIS — E875 Hyperkalemia: Secondary | ICD-10-CM | POA: Diagnosis not present

## 2018-02-13 DIAGNOSIS — N2581 Secondary hyperparathyroidism of renal origin: Secondary | ICD-10-CM | POA: Diagnosis not present

## 2018-02-13 DIAGNOSIS — E1129 Type 2 diabetes mellitus with other diabetic kidney complication: Secondary | ICD-10-CM | POA: Diagnosis not present

## 2018-02-16 DIAGNOSIS — N186 End stage renal disease: Secondary | ICD-10-CM | POA: Diagnosis not present

## 2018-02-16 DIAGNOSIS — E1129 Type 2 diabetes mellitus with other diabetic kidney complication: Secondary | ICD-10-CM | POA: Diagnosis not present

## 2018-02-16 DIAGNOSIS — E875 Hyperkalemia: Secondary | ICD-10-CM | POA: Diagnosis not present

## 2018-02-16 DIAGNOSIS — D509 Iron deficiency anemia, unspecified: Secondary | ICD-10-CM | POA: Diagnosis not present

## 2018-02-16 DIAGNOSIS — N2581 Secondary hyperparathyroidism of renal origin: Secondary | ICD-10-CM | POA: Diagnosis not present

## 2018-02-18 DIAGNOSIS — E875 Hyperkalemia: Secondary | ICD-10-CM | POA: Diagnosis not present

## 2018-02-18 DIAGNOSIS — N186 End stage renal disease: Secondary | ICD-10-CM | POA: Diagnosis not present

## 2018-02-18 DIAGNOSIS — D509 Iron deficiency anemia, unspecified: Secondary | ICD-10-CM | POA: Diagnosis not present

## 2018-02-18 DIAGNOSIS — N2581 Secondary hyperparathyroidism of renal origin: Secondary | ICD-10-CM | POA: Diagnosis not present

## 2018-02-18 DIAGNOSIS — E1129 Type 2 diabetes mellitus with other diabetic kidney complication: Secondary | ICD-10-CM | POA: Diagnosis not present

## 2018-02-20 DIAGNOSIS — N186 End stage renal disease: Secondary | ICD-10-CM | POA: Diagnosis not present

## 2018-02-20 DIAGNOSIS — N2581 Secondary hyperparathyroidism of renal origin: Secondary | ICD-10-CM | POA: Diagnosis not present

## 2018-02-20 DIAGNOSIS — D509 Iron deficiency anemia, unspecified: Secondary | ICD-10-CM | POA: Diagnosis not present

## 2018-02-20 DIAGNOSIS — E1129 Type 2 diabetes mellitus with other diabetic kidney complication: Secondary | ICD-10-CM | POA: Diagnosis not present

## 2018-02-20 DIAGNOSIS — E875 Hyperkalemia: Secondary | ICD-10-CM | POA: Diagnosis not present

## 2018-02-23 DIAGNOSIS — D509 Iron deficiency anemia, unspecified: Secondary | ICD-10-CM | POA: Diagnosis not present

## 2018-02-23 DIAGNOSIS — N2581 Secondary hyperparathyroidism of renal origin: Secondary | ICD-10-CM | POA: Diagnosis not present

## 2018-02-23 DIAGNOSIS — E875 Hyperkalemia: Secondary | ICD-10-CM | POA: Diagnosis not present

## 2018-02-23 DIAGNOSIS — E1129 Type 2 diabetes mellitus with other diabetic kidney complication: Secondary | ICD-10-CM | POA: Diagnosis not present

## 2018-02-23 DIAGNOSIS — N186 End stage renal disease: Secondary | ICD-10-CM | POA: Diagnosis not present

## 2018-02-25 DIAGNOSIS — N186 End stage renal disease: Secondary | ICD-10-CM | POA: Diagnosis not present

## 2018-02-25 DIAGNOSIS — D509 Iron deficiency anemia, unspecified: Secondary | ICD-10-CM | POA: Diagnosis not present

## 2018-02-25 DIAGNOSIS — E875 Hyperkalemia: Secondary | ICD-10-CM | POA: Diagnosis not present

## 2018-02-25 DIAGNOSIS — E1129 Type 2 diabetes mellitus with other diabetic kidney complication: Secondary | ICD-10-CM | POA: Diagnosis not present

## 2018-02-25 DIAGNOSIS — N2581 Secondary hyperparathyroidism of renal origin: Secondary | ICD-10-CM | POA: Diagnosis not present

## 2018-02-27 DIAGNOSIS — E1129 Type 2 diabetes mellitus with other diabetic kidney complication: Secondary | ICD-10-CM | POA: Diagnosis not present

## 2018-02-27 DIAGNOSIS — N186 End stage renal disease: Secondary | ICD-10-CM | POA: Diagnosis not present

## 2018-02-27 DIAGNOSIS — N2581 Secondary hyperparathyroidism of renal origin: Secondary | ICD-10-CM | POA: Diagnosis not present

## 2018-02-27 DIAGNOSIS — E875 Hyperkalemia: Secondary | ICD-10-CM | POA: Diagnosis not present

## 2018-02-27 DIAGNOSIS — D509 Iron deficiency anemia, unspecified: Secondary | ICD-10-CM | POA: Diagnosis not present

## 2018-03-02 DIAGNOSIS — E875 Hyperkalemia: Secondary | ICD-10-CM | POA: Diagnosis not present

## 2018-03-02 DIAGNOSIS — N2581 Secondary hyperparathyroidism of renal origin: Secondary | ICD-10-CM | POA: Diagnosis not present

## 2018-03-02 DIAGNOSIS — D509 Iron deficiency anemia, unspecified: Secondary | ICD-10-CM | POA: Diagnosis not present

## 2018-03-02 DIAGNOSIS — E1129 Type 2 diabetes mellitus with other diabetic kidney complication: Secondary | ICD-10-CM | POA: Diagnosis not present

## 2018-03-02 DIAGNOSIS — N186 End stage renal disease: Secondary | ICD-10-CM | POA: Diagnosis not present

## 2018-03-04 DIAGNOSIS — D509 Iron deficiency anemia, unspecified: Secondary | ICD-10-CM | POA: Diagnosis not present

## 2018-03-04 DIAGNOSIS — E1129 Type 2 diabetes mellitus with other diabetic kidney complication: Secondary | ICD-10-CM | POA: Diagnosis not present

## 2018-03-04 DIAGNOSIS — N186 End stage renal disease: Secondary | ICD-10-CM | POA: Diagnosis not present

## 2018-03-04 DIAGNOSIS — E875 Hyperkalemia: Secondary | ICD-10-CM | POA: Diagnosis not present

## 2018-03-04 DIAGNOSIS — N2581 Secondary hyperparathyroidism of renal origin: Secondary | ICD-10-CM | POA: Diagnosis not present

## 2018-03-05 DIAGNOSIS — E119 Type 2 diabetes mellitus without complications: Secondary | ICD-10-CM | POA: Diagnosis not present

## 2018-03-05 DIAGNOSIS — I120 Hypertensive chronic kidney disease with stage 5 chronic kidney disease or end stage renal disease: Secondary | ICD-10-CM | POA: Diagnosis not present

## 2018-03-05 DIAGNOSIS — I129 Hypertensive chronic kidney disease with stage 1 through stage 4 chronic kidney disease, or unspecified chronic kidney disease: Secondary | ICD-10-CM | POA: Diagnosis not present

## 2018-03-05 DIAGNOSIS — Z6841 Body Mass Index (BMI) 40.0 and over, adult: Secondary | ICD-10-CM | POA: Diagnosis not present

## 2018-03-05 DIAGNOSIS — K219 Gastro-esophageal reflux disease without esophagitis: Secondary | ICD-10-CM | POA: Diagnosis not present

## 2018-03-06 DIAGNOSIS — E875 Hyperkalemia: Secondary | ICD-10-CM | POA: Diagnosis not present

## 2018-03-06 DIAGNOSIS — N186 End stage renal disease: Secondary | ICD-10-CM | POA: Diagnosis not present

## 2018-03-06 DIAGNOSIS — N2581 Secondary hyperparathyroidism of renal origin: Secondary | ICD-10-CM | POA: Diagnosis not present

## 2018-03-06 DIAGNOSIS — D509 Iron deficiency anemia, unspecified: Secondary | ICD-10-CM | POA: Diagnosis not present

## 2018-03-06 DIAGNOSIS — E1129 Type 2 diabetes mellitus with other diabetic kidney complication: Secondary | ICD-10-CM | POA: Diagnosis not present

## 2018-03-09 DIAGNOSIS — N2581 Secondary hyperparathyroidism of renal origin: Secondary | ICD-10-CM | POA: Diagnosis not present

## 2018-03-09 DIAGNOSIS — E875 Hyperkalemia: Secondary | ICD-10-CM | POA: Diagnosis not present

## 2018-03-09 DIAGNOSIS — E1129 Type 2 diabetes mellitus with other diabetic kidney complication: Secondary | ICD-10-CM | POA: Diagnosis not present

## 2018-03-09 DIAGNOSIS — D509 Iron deficiency anemia, unspecified: Secondary | ICD-10-CM | POA: Diagnosis not present

## 2018-03-09 DIAGNOSIS — N186 End stage renal disease: Secondary | ICD-10-CM | POA: Diagnosis not present

## 2018-03-11 DIAGNOSIS — E875 Hyperkalemia: Secondary | ICD-10-CM | POA: Diagnosis not present

## 2018-03-11 DIAGNOSIS — N186 End stage renal disease: Secondary | ICD-10-CM | POA: Diagnosis not present

## 2018-03-11 DIAGNOSIS — D509 Iron deficiency anemia, unspecified: Secondary | ICD-10-CM | POA: Diagnosis not present

## 2018-03-11 DIAGNOSIS — E1129 Type 2 diabetes mellitus with other diabetic kidney complication: Secondary | ICD-10-CM | POA: Diagnosis not present

## 2018-03-11 DIAGNOSIS — N2581 Secondary hyperparathyroidism of renal origin: Secondary | ICD-10-CM | POA: Diagnosis not present

## 2018-03-13 DIAGNOSIS — D509 Iron deficiency anemia, unspecified: Secondary | ICD-10-CM | POA: Diagnosis not present

## 2018-03-13 DIAGNOSIS — E875 Hyperkalemia: Secondary | ICD-10-CM | POA: Diagnosis not present

## 2018-03-13 DIAGNOSIS — N186 End stage renal disease: Secondary | ICD-10-CM | POA: Diagnosis not present

## 2018-03-13 DIAGNOSIS — N2581 Secondary hyperparathyroidism of renal origin: Secondary | ICD-10-CM | POA: Diagnosis not present

## 2018-03-13 DIAGNOSIS — E1129 Type 2 diabetes mellitus with other diabetic kidney complication: Secondary | ICD-10-CM | POA: Diagnosis not present

## 2018-03-16 DIAGNOSIS — I158 Other secondary hypertension: Secondary | ICD-10-CM | POA: Diagnosis not present

## 2018-03-16 DIAGNOSIS — N2581 Secondary hyperparathyroidism of renal origin: Secondary | ICD-10-CM | POA: Diagnosis not present

## 2018-03-16 DIAGNOSIS — N186 End stage renal disease: Secondary | ICD-10-CM | POA: Diagnosis not present

## 2018-03-16 DIAGNOSIS — Z992 Dependence on renal dialysis: Secondary | ICD-10-CM | POA: Diagnosis not present

## 2018-03-16 DIAGNOSIS — E1129 Type 2 diabetes mellitus with other diabetic kidney complication: Secondary | ICD-10-CM | POA: Diagnosis not present

## 2018-03-16 DIAGNOSIS — D509 Iron deficiency anemia, unspecified: Secondary | ICD-10-CM | POA: Diagnosis not present

## 2018-03-16 DIAGNOSIS — E875 Hyperkalemia: Secondary | ICD-10-CM | POA: Diagnosis not present

## 2018-03-18 DIAGNOSIS — E875 Hyperkalemia: Secondary | ICD-10-CM | POA: Diagnosis not present

## 2018-03-18 DIAGNOSIS — D509 Iron deficiency anemia, unspecified: Secondary | ICD-10-CM | POA: Diagnosis not present

## 2018-03-18 DIAGNOSIS — N2581 Secondary hyperparathyroidism of renal origin: Secondary | ICD-10-CM | POA: Diagnosis not present

## 2018-03-18 DIAGNOSIS — E1129 Type 2 diabetes mellitus with other diabetic kidney complication: Secondary | ICD-10-CM | POA: Diagnosis not present

## 2018-03-18 DIAGNOSIS — N186 End stage renal disease: Secondary | ICD-10-CM | POA: Diagnosis not present

## 2018-03-20 DIAGNOSIS — E875 Hyperkalemia: Secondary | ICD-10-CM | POA: Diagnosis not present

## 2018-03-20 DIAGNOSIS — N2581 Secondary hyperparathyroidism of renal origin: Secondary | ICD-10-CM | POA: Diagnosis not present

## 2018-03-20 DIAGNOSIS — D509 Iron deficiency anemia, unspecified: Secondary | ICD-10-CM | POA: Diagnosis not present

## 2018-03-20 DIAGNOSIS — E1129 Type 2 diabetes mellitus with other diabetic kidney complication: Secondary | ICD-10-CM | POA: Diagnosis not present

## 2018-03-20 DIAGNOSIS — N186 End stage renal disease: Secondary | ICD-10-CM | POA: Diagnosis not present

## 2018-03-23 DIAGNOSIS — E875 Hyperkalemia: Secondary | ICD-10-CM | POA: Diagnosis not present

## 2018-03-23 DIAGNOSIS — N2581 Secondary hyperparathyroidism of renal origin: Secondary | ICD-10-CM | POA: Diagnosis not present

## 2018-03-23 DIAGNOSIS — E1129 Type 2 diabetes mellitus with other diabetic kidney complication: Secondary | ICD-10-CM | POA: Diagnosis not present

## 2018-03-23 DIAGNOSIS — D509 Iron deficiency anemia, unspecified: Secondary | ICD-10-CM | POA: Diagnosis not present

## 2018-03-23 DIAGNOSIS — N186 End stage renal disease: Secondary | ICD-10-CM | POA: Diagnosis not present

## 2018-03-25 DIAGNOSIS — E875 Hyperkalemia: Secondary | ICD-10-CM | POA: Diagnosis not present

## 2018-03-25 DIAGNOSIS — E1129 Type 2 diabetes mellitus with other diabetic kidney complication: Secondary | ICD-10-CM | POA: Diagnosis not present

## 2018-03-25 DIAGNOSIS — N2581 Secondary hyperparathyroidism of renal origin: Secondary | ICD-10-CM | POA: Diagnosis not present

## 2018-03-25 DIAGNOSIS — N186 End stage renal disease: Secondary | ICD-10-CM | POA: Diagnosis not present

## 2018-03-25 DIAGNOSIS — D509 Iron deficiency anemia, unspecified: Secondary | ICD-10-CM | POA: Diagnosis not present

## 2018-03-27 DIAGNOSIS — N2581 Secondary hyperparathyroidism of renal origin: Secondary | ICD-10-CM | POA: Diagnosis not present

## 2018-03-27 DIAGNOSIS — E875 Hyperkalemia: Secondary | ICD-10-CM | POA: Diagnosis not present

## 2018-03-27 DIAGNOSIS — N186 End stage renal disease: Secondary | ICD-10-CM | POA: Diagnosis not present

## 2018-03-27 DIAGNOSIS — E1129 Type 2 diabetes mellitus with other diabetic kidney complication: Secondary | ICD-10-CM | POA: Diagnosis not present

## 2018-03-27 DIAGNOSIS — D509 Iron deficiency anemia, unspecified: Secondary | ICD-10-CM | POA: Diagnosis not present

## 2018-03-30 DIAGNOSIS — N2581 Secondary hyperparathyroidism of renal origin: Secondary | ICD-10-CM | POA: Diagnosis not present

## 2018-03-30 DIAGNOSIS — D509 Iron deficiency anemia, unspecified: Secondary | ICD-10-CM | POA: Diagnosis not present

## 2018-03-30 DIAGNOSIS — E875 Hyperkalemia: Secondary | ICD-10-CM | POA: Diagnosis not present

## 2018-03-30 DIAGNOSIS — N186 End stage renal disease: Secondary | ICD-10-CM | POA: Diagnosis not present

## 2018-03-30 DIAGNOSIS — E1129 Type 2 diabetes mellitus with other diabetic kidney complication: Secondary | ICD-10-CM | POA: Diagnosis not present

## 2018-04-01 DIAGNOSIS — E1129 Type 2 diabetes mellitus with other diabetic kidney complication: Secondary | ICD-10-CM | POA: Diagnosis not present

## 2018-04-01 DIAGNOSIS — N186 End stage renal disease: Secondary | ICD-10-CM | POA: Diagnosis not present

## 2018-04-01 DIAGNOSIS — N2581 Secondary hyperparathyroidism of renal origin: Secondary | ICD-10-CM | POA: Diagnosis not present

## 2018-04-01 DIAGNOSIS — D509 Iron deficiency anemia, unspecified: Secondary | ICD-10-CM | POA: Diagnosis not present

## 2018-04-01 DIAGNOSIS — E875 Hyperkalemia: Secondary | ICD-10-CM | POA: Diagnosis not present

## 2018-04-03 DIAGNOSIS — N186 End stage renal disease: Secondary | ICD-10-CM | POA: Diagnosis not present

## 2018-04-03 DIAGNOSIS — E1129 Type 2 diabetes mellitus with other diabetic kidney complication: Secondary | ICD-10-CM | POA: Diagnosis not present

## 2018-04-03 DIAGNOSIS — E875 Hyperkalemia: Secondary | ICD-10-CM | POA: Diagnosis not present

## 2018-04-03 DIAGNOSIS — N2581 Secondary hyperparathyroidism of renal origin: Secondary | ICD-10-CM | POA: Diagnosis not present

## 2018-04-03 DIAGNOSIS — D509 Iron deficiency anemia, unspecified: Secondary | ICD-10-CM | POA: Diagnosis not present

## 2018-04-06 DIAGNOSIS — E875 Hyperkalemia: Secondary | ICD-10-CM | POA: Diagnosis not present

## 2018-04-06 DIAGNOSIS — N186 End stage renal disease: Secondary | ICD-10-CM | POA: Diagnosis not present

## 2018-04-06 DIAGNOSIS — E1129 Type 2 diabetes mellitus with other diabetic kidney complication: Secondary | ICD-10-CM | POA: Diagnosis not present

## 2018-04-06 DIAGNOSIS — D509 Iron deficiency anemia, unspecified: Secondary | ICD-10-CM | POA: Diagnosis not present

## 2018-04-06 DIAGNOSIS — N2581 Secondary hyperparathyroidism of renal origin: Secondary | ICD-10-CM | POA: Diagnosis not present

## 2018-04-08 DIAGNOSIS — D509 Iron deficiency anemia, unspecified: Secondary | ICD-10-CM | POA: Diagnosis not present

## 2018-04-08 DIAGNOSIS — E1129 Type 2 diabetes mellitus with other diabetic kidney complication: Secondary | ICD-10-CM | POA: Diagnosis not present

## 2018-04-08 DIAGNOSIS — N186 End stage renal disease: Secondary | ICD-10-CM | POA: Diagnosis not present

## 2018-04-08 DIAGNOSIS — N2581 Secondary hyperparathyroidism of renal origin: Secondary | ICD-10-CM | POA: Diagnosis not present

## 2018-04-08 DIAGNOSIS — E875 Hyperkalemia: Secondary | ICD-10-CM | POA: Diagnosis not present

## 2018-04-10 DIAGNOSIS — E875 Hyperkalemia: Secondary | ICD-10-CM | POA: Diagnosis not present

## 2018-04-10 DIAGNOSIS — N2581 Secondary hyperparathyroidism of renal origin: Secondary | ICD-10-CM | POA: Diagnosis not present

## 2018-04-10 DIAGNOSIS — E1129 Type 2 diabetes mellitus with other diabetic kidney complication: Secondary | ICD-10-CM | POA: Diagnosis not present

## 2018-04-10 DIAGNOSIS — D509 Iron deficiency anemia, unspecified: Secondary | ICD-10-CM | POA: Diagnosis not present

## 2018-04-10 DIAGNOSIS — N186 End stage renal disease: Secondary | ICD-10-CM | POA: Diagnosis not present

## 2018-04-13 DIAGNOSIS — N2581 Secondary hyperparathyroidism of renal origin: Secondary | ICD-10-CM | POA: Diagnosis not present

## 2018-04-13 DIAGNOSIS — E875 Hyperkalemia: Secondary | ICD-10-CM | POA: Diagnosis not present

## 2018-04-13 DIAGNOSIS — N186 End stage renal disease: Secondary | ICD-10-CM | POA: Diagnosis not present

## 2018-04-13 DIAGNOSIS — D509 Iron deficiency anemia, unspecified: Secondary | ICD-10-CM | POA: Diagnosis not present

## 2018-04-13 DIAGNOSIS — E1129 Type 2 diabetes mellitus with other diabetic kidney complication: Secondary | ICD-10-CM | POA: Diagnosis not present

## 2018-04-15 DIAGNOSIS — N186 End stage renal disease: Secondary | ICD-10-CM | POA: Diagnosis not present

## 2018-04-15 DIAGNOSIS — I158 Other secondary hypertension: Secondary | ICD-10-CM | POA: Diagnosis not present

## 2018-04-15 DIAGNOSIS — Z992 Dependence on renal dialysis: Secondary | ICD-10-CM | POA: Diagnosis not present

## 2018-04-15 DIAGNOSIS — E875 Hyperkalemia: Secondary | ICD-10-CM | POA: Diagnosis not present

## 2018-04-15 DIAGNOSIS — E1129 Type 2 diabetes mellitus with other diabetic kidney complication: Secondary | ICD-10-CM | POA: Diagnosis not present

## 2018-04-15 DIAGNOSIS — N2581 Secondary hyperparathyroidism of renal origin: Secondary | ICD-10-CM | POA: Diagnosis not present

## 2018-04-16 DIAGNOSIS — J441 Chronic obstructive pulmonary disease with (acute) exacerbation: Secondary | ICD-10-CM | POA: Diagnosis not present

## 2018-04-16 DIAGNOSIS — I120 Hypertensive chronic kidney disease with stage 5 chronic kidney disease or end stage renal disease: Secondary | ICD-10-CM | POA: Diagnosis not present

## 2018-04-16 DIAGNOSIS — E118 Type 2 diabetes mellitus with unspecified complications: Secondary | ICD-10-CM | POA: Diagnosis not present

## 2018-04-17 DIAGNOSIS — N2581 Secondary hyperparathyroidism of renal origin: Secondary | ICD-10-CM | POA: Diagnosis not present

## 2018-04-17 DIAGNOSIS — E1129 Type 2 diabetes mellitus with other diabetic kidney complication: Secondary | ICD-10-CM | POA: Diagnosis not present

## 2018-04-17 DIAGNOSIS — E875 Hyperkalemia: Secondary | ICD-10-CM | POA: Diagnosis not present

## 2018-04-17 DIAGNOSIS — N186 End stage renal disease: Secondary | ICD-10-CM | POA: Diagnosis not present

## 2018-04-20 DIAGNOSIS — E1129 Type 2 diabetes mellitus with other diabetic kidney complication: Secondary | ICD-10-CM | POA: Diagnosis not present

## 2018-04-20 DIAGNOSIS — E875 Hyperkalemia: Secondary | ICD-10-CM | POA: Diagnosis not present

## 2018-04-20 DIAGNOSIS — N2581 Secondary hyperparathyroidism of renal origin: Secondary | ICD-10-CM | POA: Diagnosis not present

## 2018-04-20 DIAGNOSIS — N186 End stage renal disease: Secondary | ICD-10-CM | POA: Diagnosis not present

## 2018-04-22 DIAGNOSIS — N2581 Secondary hyperparathyroidism of renal origin: Secondary | ICD-10-CM | POA: Diagnosis not present

## 2018-04-22 DIAGNOSIS — N186 End stage renal disease: Secondary | ICD-10-CM | POA: Diagnosis not present

## 2018-04-22 DIAGNOSIS — E1129 Type 2 diabetes mellitus with other diabetic kidney complication: Secondary | ICD-10-CM | POA: Diagnosis not present

## 2018-04-22 DIAGNOSIS — E875 Hyperkalemia: Secondary | ICD-10-CM | POA: Diagnosis not present

## 2018-04-24 DIAGNOSIS — E1129 Type 2 diabetes mellitus with other diabetic kidney complication: Secondary | ICD-10-CM | POA: Diagnosis not present

## 2018-04-24 DIAGNOSIS — E875 Hyperkalemia: Secondary | ICD-10-CM | POA: Diagnosis not present

## 2018-04-24 DIAGNOSIS — N186 End stage renal disease: Secondary | ICD-10-CM | POA: Diagnosis not present

## 2018-04-24 DIAGNOSIS — N2581 Secondary hyperparathyroidism of renal origin: Secondary | ICD-10-CM | POA: Diagnosis not present

## 2018-04-27 DIAGNOSIS — E875 Hyperkalemia: Secondary | ICD-10-CM | POA: Diagnosis not present

## 2018-04-27 DIAGNOSIS — E1129 Type 2 diabetes mellitus with other diabetic kidney complication: Secondary | ICD-10-CM | POA: Diagnosis not present

## 2018-04-27 DIAGNOSIS — N186 End stage renal disease: Secondary | ICD-10-CM | POA: Diagnosis not present

## 2018-04-27 DIAGNOSIS — N2581 Secondary hyperparathyroidism of renal origin: Secondary | ICD-10-CM | POA: Diagnosis not present

## 2018-04-29 DIAGNOSIS — E1129 Type 2 diabetes mellitus with other diabetic kidney complication: Secondary | ICD-10-CM | POA: Diagnosis not present

## 2018-04-29 DIAGNOSIS — N186 End stage renal disease: Secondary | ICD-10-CM | POA: Diagnosis not present

## 2018-04-29 DIAGNOSIS — E875 Hyperkalemia: Secondary | ICD-10-CM | POA: Diagnosis not present

## 2018-04-29 DIAGNOSIS — N2581 Secondary hyperparathyroidism of renal origin: Secondary | ICD-10-CM | POA: Diagnosis not present

## 2018-05-01 DIAGNOSIS — E875 Hyperkalemia: Secondary | ICD-10-CM | POA: Diagnosis not present

## 2018-05-01 DIAGNOSIS — E1129 Type 2 diabetes mellitus with other diabetic kidney complication: Secondary | ICD-10-CM | POA: Diagnosis not present

## 2018-05-01 DIAGNOSIS — N186 End stage renal disease: Secondary | ICD-10-CM | POA: Diagnosis not present

## 2018-05-01 DIAGNOSIS — N2581 Secondary hyperparathyroidism of renal origin: Secondary | ICD-10-CM | POA: Diagnosis not present

## 2018-05-04 DIAGNOSIS — N2581 Secondary hyperparathyroidism of renal origin: Secondary | ICD-10-CM | POA: Diagnosis not present

## 2018-05-04 DIAGNOSIS — N186 End stage renal disease: Secondary | ICD-10-CM | POA: Diagnosis not present

## 2018-05-04 DIAGNOSIS — E1129 Type 2 diabetes mellitus with other diabetic kidney complication: Secondary | ICD-10-CM | POA: Diagnosis not present

## 2018-05-04 DIAGNOSIS — E875 Hyperkalemia: Secondary | ICD-10-CM | POA: Diagnosis not present

## 2018-05-06 DIAGNOSIS — E1129 Type 2 diabetes mellitus with other diabetic kidney complication: Secondary | ICD-10-CM | POA: Diagnosis not present

## 2018-05-06 DIAGNOSIS — E875 Hyperkalemia: Secondary | ICD-10-CM | POA: Diagnosis not present

## 2018-05-06 DIAGNOSIS — N2581 Secondary hyperparathyroidism of renal origin: Secondary | ICD-10-CM | POA: Diagnosis not present

## 2018-05-06 DIAGNOSIS — N186 End stage renal disease: Secondary | ICD-10-CM | POA: Diagnosis not present

## 2018-05-07 DIAGNOSIS — Z0000 Encounter for general adult medical examination without abnormal findings: Secondary | ICD-10-CM | POA: Diagnosis not present

## 2018-05-08 DIAGNOSIS — E875 Hyperkalemia: Secondary | ICD-10-CM | POA: Diagnosis not present

## 2018-05-08 DIAGNOSIS — N186 End stage renal disease: Secondary | ICD-10-CM | POA: Diagnosis not present

## 2018-05-08 DIAGNOSIS — N2581 Secondary hyperparathyroidism of renal origin: Secondary | ICD-10-CM | POA: Diagnosis not present

## 2018-05-08 DIAGNOSIS — E1129 Type 2 diabetes mellitus with other diabetic kidney complication: Secondary | ICD-10-CM | POA: Diagnosis not present

## 2018-05-11 DIAGNOSIS — E875 Hyperkalemia: Secondary | ICD-10-CM | POA: Diagnosis not present

## 2018-05-11 DIAGNOSIS — N186 End stage renal disease: Secondary | ICD-10-CM | POA: Diagnosis not present

## 2018-05-11 DIAGNOSIS — N2581 Secondary hyperparathyroidism of renal origin: Secondary | ICD-10-CM | POA: Diagnosis not present

## 2018-05-11 DIAGNOSIS — E1129 Type 2 diabetes mellitus with other diabetic kidney complication: Secondary | ICD-10-CM | POA: Diagnosis not present

## 2018-05-13 DIAGNOSIS — N186 End stage renal disease: Secondary | ICD-10-CM | POA: Diagnosis not present

## 2018-05-13 DIAGNOSIS — N2581 Secondary hyperparathyroidism of renal origin: Secondary | ICD-10-CM | POA: Diagnosis not present

## 2018-05-13 DIAGNOSIS — E875 Hyperkalemia: Secondary | ICD-10-CM | POA: Diagnosis not present

## 2018-05-13 DIAGNOSIS — E1129 Type 2 diabetes mellitus with other diabetic kidney complication: Secondary | ICD-10-CM | POA: Diagnosis not present

## 2018-05-15 DIAGNOSIS — N186 End stage renal disease: Secondary | ICD-10-CM | POA: Diagnosis not present

## 2018-05-15 DIAGNOSIS — N2581 Secondary hyperparathyroidism of renal origin: Secondary | ICD-10-CM | POA: Diagnosis not present

## 2018-05-15 DIAGNOSIS — E1129 Type 2 diabetes mellitus with other diabetic kidney complication: Secondary | ICD-10-CM | POA: Diagnosis not present

## 2018-05-15 DIAGNOSIS — E875 Hyperkalemia: Secondary | ICD-10-CM | POA: Diagnosis not present

## 2018-05-16 DIAGNOSIS — I158 Other secondary hypertension: Secondary | ICD-10-CM | POA: Diagnosis not present

## 2018-05-16 DIAGNOSIS — Z992 Dependence on renal dialysis: Secondary | ICD-10-CM | POA: Diagnosis not present

## 2018-05-16 DIAGNOSIS — N186 End stage renal disease: Secondary | ICD-10-CM | POA: Diagnosis not present

## 2018-05-18 DIAGNOSIS — E1129 Type 2 diabetes mellitus with other diabetic kidney complication: Secondary | ICD-10-CM | POA: Diagnosis not present

## 2018-05-18 DIAGNOSIS — D631 Anemia in chronic kidney disease: Secondary | ICD-10-CM | POA: Diagnosis not present

## 2018-05-18 DIAGNOSIS — N186 End stage renal disease: Secondary | ICD-10-CM | POA: Diagnosis not present

## 2018-05-18 DIAGNOSIS — N2581 Secondary hyperparathyroidism of renal origin: Secondary | ICD-10-CM | POA: Diagnosis not present

## 2018-05-18 DIAGNOSIS — E875 Hyperkalemia: Secondary | ICD-10-CM | POA: Diagnosis not present

## 2018-05-20 DIAGNOSIS — D631 Anemia in chronic kidney disease: Secondary | ICD-10-CM | POA: Diagnosis not present

## 2018-05-20 DIAGNOSIS — E875 Hyperkalemia: Secondary | ICD-10-CM | POA: Diagnosis not present

## 2018-05-20 DIAGNOSIS — E1129 Type 2 diabetes mellitus with other diabetic kidney complication: Secondary | ICD-10-CM | POA: Diagnosis not present

## 2018-05-20 DIAGNOSIS — N186 End stage renal disease: Secondary | ICD-10-CM | POA: Diagnosis not present

## 2018-05-20 DIAGNOSIS — N2581 Secondary hyperparathyroidism of renal origin: Secondary | ICD-10-CM | POA: Diagnosis not present

## 2018-05-22 DIAGNOSIS — D631 Anemia in chronic kidney disease: Secondary | ICD-10-CM | POA: Diagnosis not present

## 2018-05-22 DIAGNOSIS — N186 End stage renal disease: Secondary | ICD-10-CM | POA: Diagnosis not present

## 2018-05-22 DIAGNOSIS — E1129 Type 2 diabetes mellitus with other diabetic kidney complication: Secondary | ICD-10-CM | POA: Diagnosis not present

## 2018-05-22 DIAGNOSIS — E875 Hyperkalemia: Secondary | ICD-10-CM | POA: Diagnosis not present

## 2018-05-22 DIAGNOSIS — N2581 Secondary hyperparathyroidism of renal origin: Secondary | ICD-10-CM | POA: Diagnosis not present

## 2018-05-25 DIAGNOSIS — D631 Anemia in chronic kidney disease: Secondary | ICD-10-CM | POA: Diagnosis not present

## 2018-05-25 DIAGNOSIS — N186 End stage renal disease: Secondary | ICD-10-CM | POA: Diagnosis not present

## 2018-05-25 DIAGNOSIS — E1129 Type 2 diabetes mellitus with other diabetic kidney complication: Secondary | ICD-10-CM | POA: Diagnosis not present

## 2018-05-25 DIAGNOSIS — E875 Hyperkalemia: Secondary | ICD-10-CM | POA: Diagnosis not present

## 2018-05-25 DIAGNOSIS — N2581 Secondary hyperparathyroidism of renal origin: Secondary | ICD-10-CM | POA: Diagnosis not present

## 2018-05-26 IMAGING — CR DG CHEST 2V
2 series · 2 of 2 positions shown · non-contrast
Comparison: None.

CLINICAL DATA: Cough for 2 months.

EXAM:
CHEST  2 VIEW

[w chest pa]
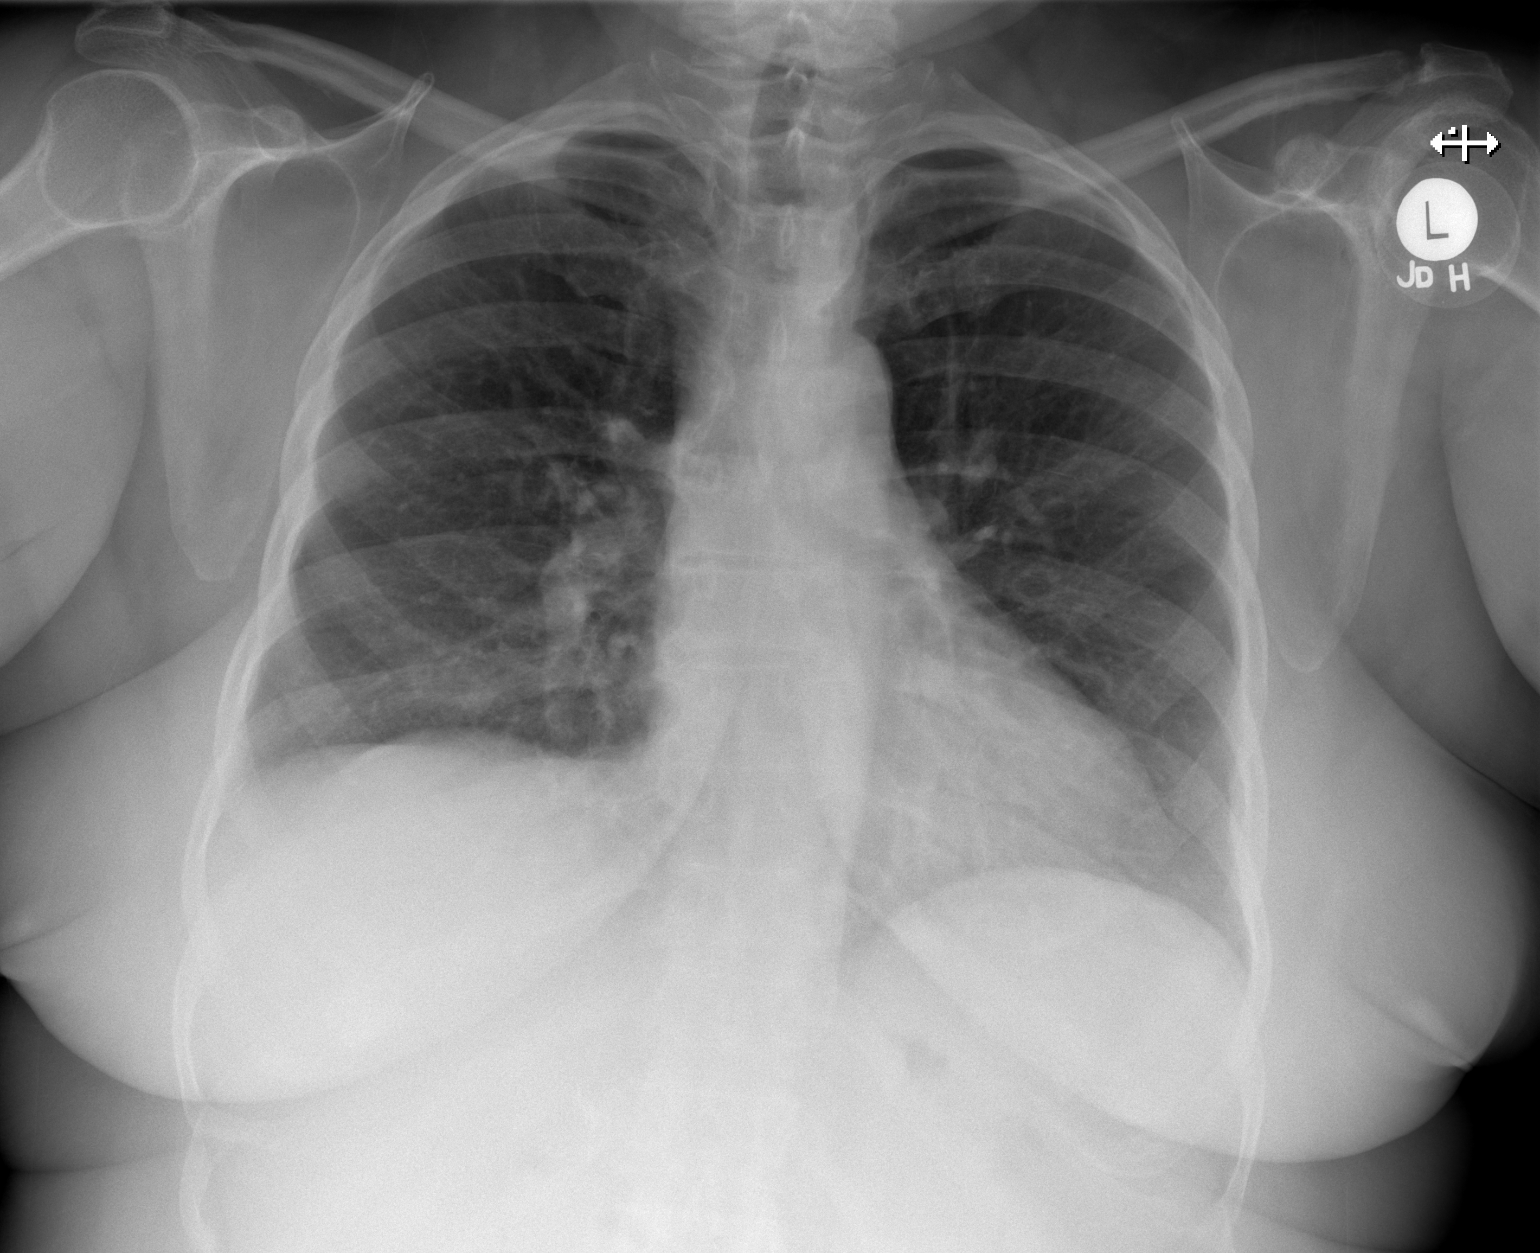

[w chest lat]
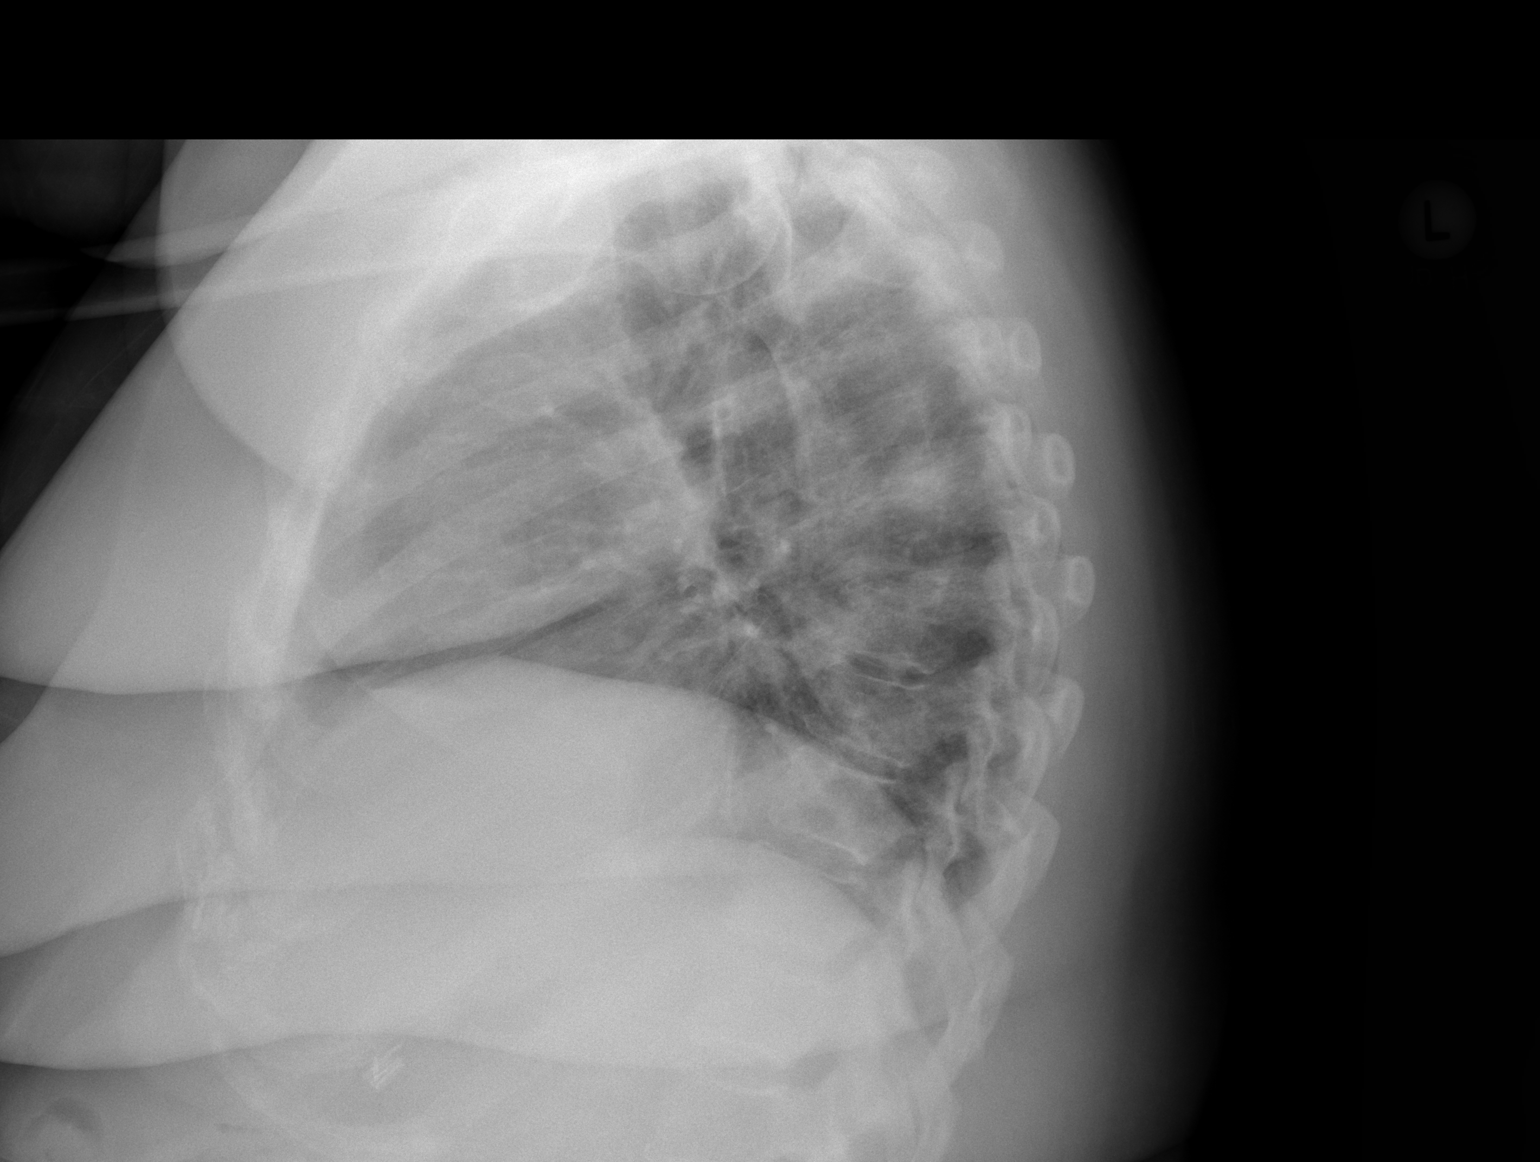

[2 of 2 positions shown; findings below may reference images not displayed]

FINDINGS: Heart and mediastinal contours are within normal limits. No focal
opacities or effusions. No acute bony abnormality.
IMPRESSION: No active cardiopulmonary disease.

## 2018-05-27 DIAGNOSIS — E875 Hyperkalemia: Secondary | ICD-10-CM | POA: Diagnosis not present

## 2018-05-27 DIAGNOSIS — N186 End stage renal disease: Secondary | ICD-10-CM | POA: Diagnosis not present

## 2018-05-27 DIAGNOSIS — D631 Anemia in chronic kidney disease: Secondary | ICD-10-CM | POA: Diagnosis not present

## 2018-05-27 DIAGNOSIS — N2581 Secondary hyperparathyroidism of renal origin: Secondary | ICD-10-CM | POA: Diagnosis not present

## 2018-05-27 DIAGNOSIS — E1129 Type 2 diabetes mellitus with other diabetic kidney complication: Secondary | ICD-10-CM | POA: Diagnosis not present

## 2018-05-29 DIAGNOSIS — E1129 Type 2 diabetes mellitus with other diabetic kidney complication: Secondary | ICD-10-CM | POA: Diagnosis not present

## 2018-05-29 DIAGNOSIS — E875 Hyperkalemia: Secondary | ICD-10-CM | POA: Diagnosis not present

## 2018-05-29 DIAGNOSIS — D631 Anemia in chronic kidney disease: Secondary | ICD-10-CM | POA: Diagnosis not present

## 2018-05-29 DIAGNOSIS — N2581 Secondary hyperparathyroidism of renal origin: Secondary | ICD-10-CM | POA: Diagnosis not present

## 2018-05-29 DIAGNOSIS — N186 End stage renal disease: Secondary | ICD-10-CM | POA: Diagnosis not present

## 2018-06-01 DIAGNOSIS — E875 Hyperkalemia: Secondary | ICD-10-CM | POA: Diagnosis not present

## 2018-06-01 DIAGNOSIS — N2581 Secondary hyperparathyroidism of renal origin: Secondary | ICD-10-CM | POA: Diagnosis not present

## 2018-06-01 DIAGNOSIS — N186 End stage renal disease: Secondary | ICD-10-CM | POA: Diagnosis not present

## 2018-06-01 DIAGNOSIS — E1129 Type 2 diabetes mellitus with other diabetic kidney complication: Secondary | ICD-10-CM | POA: Diagnosis not present

## 2018-06-01 DIAGNOSIS — D631 Anemia in chronic kidney disease: Secondary | ICD-10-CM | POA: Diagnosis not present

## 2018-06-03 DIAGNOSIS — E875 Hyperkalemia: Secondary | ICD-10-CM | POA: Diagnosis not present

## 2018-06-03 DIAGNOSIS — D631 Anemia in chronic kidney disease: Secondary | ICD-10-CM | POA: Diagnosis not present

## 2018-06-03 DIAGNOSIS — E1129 Type 2 diabetes mellitus with other diabetic kidney complication: Secondary | ICD-10-CM | POA: Diagnosis not present

## 2018-06-03 DIAGNOSIS — N186 End stage renal disease: Secondary | ICD-10-CM | POA: Diagnosis not present

## 2018-06-03 DIAGNOSIS — N2581 Secondary hyperparathyroidism of renal origin: Secondary | ICD-10-CM | POA: Diagnosis not present

## 2018-06-05 DIAGNOSIS — N186 End stage renal disease: Secondary | ICD-10-CM | POA: Diagnosis not present

## 2018-06-05 DIAGNOSIS — D631 Anemia in chronic kidney disease: Secondary | ICD-10-CM | POA: Diagnosis not present

## 2018-06-05 DIAGNOSIS — N2581 Secondary hyperparathyroidism of renal origin: Secondary | ICD-10-CM | POA: Diagnosis not present

## 2018-06-05 DIAGNOSIS — E875 Hyperkalemia: Secondary | ICD-10-CM | POA: Diagnosis not present

## 2018-06-05 DIAGNOSIS — E1129 Type 2 diabetes mellitus with other diabetic kidney complication: Secondary | ICD-10-CM | POA: Diagnosis not present

## 2018-06-08 DIAGNOSIS — E875 Hyperkalemia: Secondary | ICD-10-CM | POA: Diagnosis not present

## 2018-06-08 DIAGNOSIS — N2581 Secondary hyperparathyroidism of renal origin: Secondary | ICD-10-CM | POA: Diagnosis not present

## 2018-06-08 DIAGNOSIS — D631 Anemia in chronic kidney disease: Secondary | ICD-10-CM | POA: Diagnosis not present

## 2018-06-08 DIAGNOSIS — E1129 Type 2 diabetes mellitus with other diabetic kidney complication: Secondary | ICD-10-CM | POA: Diagnosis not present

## 2018-06-08 DIAGNOSIS — N186 End stage renal disease: Secondary | ICD-10-CM | POA: Diagnosis not present

## 2018-06-10 DIAGNOSIS — D631 Anemia in chronic kidney disease: Secondary | ICD-10-CM | POA: Diagnosis not present

## 2018-06-10 DIAGNOSIS — N2581 Secondary hyperparathyroidism of renal origin: Secondary | ICD-10-CM | POA: Diagnosis not present

## 2018-06-10 DIAGNOSIS — E875 Hyperkalemia: Secondary | ICD-10-CM | POA: Diagnosis not present

## 2018-06-10 DIAGNOSIS — E1129 Type 2 diabetes mellitus with other diabetic kidney complication: Secondary | ICD-10-CM | POA: Diagnosis not present

## 2018-06-10 DIAGNOSIS — N186 End stage renal disease: Secondary | ICD-10-CM | POA: Diagnosis not present

## 2018-06-12 DIAGNOSIS — N186 End stage renal disease: Secondary | ICD-10-CM | POA: Diagnosis not present

## 2018-06-12 DIAGNOSIS — E1129 Type 2 diabetes mellitus with other diabetic kidney complication: Secondary | ICD-10-CM | POA: Diagnosis not present

## 2018-06-12 DIAGNOSIS — N2581 Secondary hyperparathyroidism of renal origin: Secondary | ICD-10-CM | POA: Diagnosis not present

## 2018-06-12 DIAGNOSIS — D631 Anemia in chronic kidney disease: Secondary | ICD-10-CM | POA: Diagnosis not present

## 2018-06-12 DIAGNOSIS — E875 Hyperkalemia: Secondary | ICD-10-CM | POA: Diagnosis not present

## 2018-06-15 DIAGNOSIS — E875 Hyperkalemia: Secondary | ICD-10-CM | POA: Diagnosis not present

## 2018-06-15 DIAGNOSIS — N186 End stage renal disease: Secondary | ICD-10-CM | POA: Diagnosis not present

## 2018-06-15 DIAGNOSIS — Z992 Dependence on renal dialysis: Secondary | ICD-10-CM | POA: Diagnosis not present

## 2018-06-15 DIAGNOSIS — N2581 Secondary hyperparathyroidism of renal origin: Secondary | ICD-10-CM | POA: Diagnosis not present

## 2018-06-15 DIAGNOSIS — I158 Other secondary hypertension: Secondary | ICD-10-CM | POA: Diagnosis not present

## 2018-06-15 DIAGNOSIS — D631 Anemia in chronic kidney disease: Secondary | ICD-10-CM | POA: Diagnosis not present

## 2018-06-15 DIAGNOSIS — E1129 Type 2 diabetes mellitus with other diabetic kidney complication: Secondary | ICD-10-CM | POA: Diagnosis not present

## 2018-06-16 DIAGNOSIS — E11649 Type 2 diabetes mellitus with hypoglycemia without coma: Secondary | ICD-10-CM | POA: Diagnosis not present

## 2018-06-16 DIAGNOSIS — H40033 Anatomical narrow angle, bilateral: Secondary | ICD-10-CM | POA: Diagnosis not present

## 2018-06-16 DIAGNOSIS — H25813 Combined forms of age-related cataract, bilateral: Secondary | ICD-10-CM | POA: Diagnosis not present

## 2018-06-16 DIAGNOSIS — I129 Hypertensive chronic kidney disease with stage 1 through stage 4 chronic kidney disease, or unspecified chronic kidney disease: Secondary | ICD-10-CM | POA: Diagnosis not present

## 2018-06-16 DIAGNOSIS — E113313 Type 2 diabetes mellitus with moderate nonproliferative diabetic retinopathy with macular edema, bilateral: Secondary | ICD-10-CM | POA: Diagnosis not present

## 2018-06-16 DIAGNOSIS — I10 Essential (primary) hypertension: Secondary | ICD-10-CM | POA: Diagnosis not present

## 2018-06-17 DIAGNOSIS — E875 Hyperkalemia: Secondary | ICD-10-CM | POA: Diagnosis not present

## 2018-06-17 DIAGNOSIS — N186 End stage renal disease: Secondary | ICD-10-CM | POA: Diagnosis not present

## 2018-06-17 DIAGNOSIS — D631 Anemia in chronic kidney disease: Secondary | ICD-10-CM | POA: Diagnosis not present

## 2018-06-17 DIAGNOSIS — N2581 Secondary hyperparathyroidism of renal origin: Secondary | ICD-10-CM | POA: Diagnosis not present

## 2018-06-17 DIAGNOSIS — E1129 Type 2 diabetes mellitus with other diabetic kidney complication: Secondary | ICD-10-CM | POA: Diagnosis not present

## 2018-06-19 DIAGNOSIS — N186 End stage renal disease: Secondary | ICD-10-CM | POA: Diagnosis not present

## 2018-06-19 DIAGNOSIS — N2581 Secondary hyperparathyroidism of renal origin: Secondary | ICD-10-CM | POA: Diagnosis not present

## 2018-06-19 DIAGNOSIS — E875 Hyperkalemia: Secondary | ICD-10-CM | POA: Diagnosis not present

## 2018-06-19 DIAGNOSIS — D631 Anemia in chronic kidney disease: Secondary | ICD-10-CM | POA: Diagnosis not present

## 2018-06-19 DIAGNOSIS — E1129 Type 2 diabetes mellitus with other diabetic kidney complication: Secondary | ICD-10-CM | POA: Diagnosis not present

## 2018-06-22 DIAGNOSIS — E875 Hyperkalemia: Secondary | ICD-10-CM | POA: Diagnosis not present

## 2018-06-22 DIAGNOSIS — D631 Anemia in chronic kidney disease: Secondary | ICD-10-CM | POA: Diagnosis not present

## 2018-06-22 DIAGNOSIS — N186 End stage renal disease: Secondary | ICD-10-CM | POA: Diagnosis not present

## 2018-06-22 DIAGNOSIS — N2581 Secondary hyperparathyroidism of renal origin: Secondary | ICD-10-CM | POA: Diagnosis not present

## 2018-06-22 DIAGNOSIS — E1129 Type 2 diabetes mellitus with other diabetic kidney complication: Secondary | ICD-10-CM | POA: Diagnosis not present

## 2018-06-23 ENCOUNTER — Ambulatory Visit
Admission: RE | Admit: 2018-06-23 | Discharge: 2018-06-23 | Disposition: A | Discharge status: Home / Self Care | Payer: Medicare Other | Source: Ambulatory Visit | Attending: Nephrology | Admitting: Nephrology

## 2018-06-23 ENCOUNTER — Other Ambulatory Visit: Payer: Self-pay | Admitting: Nephrology

## 2018-06-23 DIAGNOSIS — R7611 Nonspecific reaction to tuberculin skin test without active tuberculosis: Secondary | ICD-10-CM | POA: Diagnosis not present

## 2018-06-24 DIAGNOSIS — D631 Anemia in chronic kidney disease: Secondary | ICD-10-CM | POA: Diagnosis not present

## 2018-06-24 DIAGNOSIS — E875 Hyperkalemia: Secondary | ICD-10-CM | POA: Diagnosis not present

## 2018-06-24 DIAGNOSIS — N186 End stage renal disease: Secondary | ICD-10-CM | POA: Diagnosis not present

## 2018-06-24 DIAGNOSIS — N2581 Secondary hyperparathyroidism of renal origin: Secondary | ICD-10-CM | POA: Diagnosis not present

## 2018-06-24 DIAGNOSIS — E1129 Type 2 diabetes mellitus with other diabetic kidney complication: Secondary | ICD-10-CM | POA: Diagnosis not present

## 2018-06-26 DIAGNOSIS — E875 Hyperkalemia: Secondary | ICD-10-CM | POA: Diagnosis not present

## 2018-06-26 DIAGNOSIS — D631 Anemia in chronic kidney disease: Secondary | ICD-10-CM | POA: Diagnosis not present

## 2018-06-26 DIAGNOSIS — E1129 Type 2 diabetes mellitus with other diabetic kidney complication: Secondary | ICD-10-CM | POA: Diagnosis not present

## 2018-06-26 DIAGNOSIS — N186 End stage renal disease: Secondary | ICD-10-CM | POA: Diagnosis not present

## 2018-06-26 DIAGNOSIS — N2581 Secondary hyperparathyroidism of renal origin: Secondary | ICD-10-CM | POA: Diagnosis not present

## 2018-06-29 DIAGNOSIS — D631 Anemia in chronic kidney disease: Secondary | ICD-10-CM | POA: Diagnosis not present

## 2018-06-29 DIAGNOSIS — E875 Hyperkalemia: Secondary | ICD-10-CM | POA: Diagnosis not present

## 2018-06-29 DIAGNOSIS — N186 End stage renal disease: Secondary | ICD-10-CM | POA: Diagnosis not present

## 2018-06-29 DIAGNOSIS — E1129 Type 2 diabetes mellitus with other diabetic kidney complication: Secondary | ICD-10-CM | POA: Diagnosis not present

## 2018-06-29 DIAGNOSIS — N2581 Secondary hyperparathyroidism of renal origin: Secondary | ICD-10-CM | POA: Diagnosis not present

## 2018-07-02 DIAGNOSIS — N186 End stage renal disease: Secondary | ICD-10-CM | POA: Diagnosis not present

## 2018-07-02 DIAGNOSIS — Z992 Dependence on renal dialysis: Secondary | ICD-10-CM | POA: Diagnosis not present

## 2018-07-02 DIAGNOSIS — N2581 Secondary hyperparathyroidism of renal origin: Secondary | ICD-10-CM | POA: Diagnosis not present

## 2018-07-04 DIAGNOSIS — N2581 Secondary hyperparathyroidism of renal origin: Secondary | ICD-10-CM | POA: Diagnosis not present

## 2018-07-04 DIAGNOSIS — N186 End stage renal disease: Secondary | ICD-10-CM | POA: Diagnosis not present

## 2018-07-04 DIAGNOSIS — Z992 Dependence on renal dialysis: Secondary | ICD-10-CM | POA: Diagnosis not present

## 2018-07-06 DIAGNOSIS — D631 Anemia in chronic kidney disease: Secondary | ICD-10-CM | POA: Diagnosis not present

## 2018-07-06 DIAGNOSIS — E1129 Type 2 diabetes mellitus with other diabetic kidney complication: Secondary | ICD-10-CM | POA: Diagnosis not present

## 2018-07-06 DIAGNOSIS — E875 Hyperkalemia: Secondary | ICD-10-CM | POA: Diagnosis not present

## 2018-07-06 DIAGNOSIS — N186 End stage renal disease: Secondary | ICD-10-CM | POA: Diagnosis not present

## 2018-07-06 DIAGNOSIS — N2581 Secondary hyperparathyroidism of renal origin: Secondary | ICD-10-CM | POA: Diagnosis not present

## 2018-07-08 DIAGNOSIS — N186 End stage renal disease: Secondary | ICD-10-CM | POA: Diagnosis not present

## 2018-07-08 DIAGNOSIS — E875 Hyperkalemia: Secondary | ICD-10-CM | POA: Diagnosis not present

## 2018-07-08 DIAGNOSIS — E1129 Type 2 diabetes mellitus with other diabetic kidney complication: Secondary | ICD-10-CM | POA: Diagnosis not present

## 2018-07-08 DIAGNOSIS — N2581 Secondary hyperparathyroidism of renal origin: Secondary | ICD-10-CM | POA: Diagnosis not present

## 2018-07-08 DIAGNOSIS — D631 Anemia in chronic kidney disease: Secondary | ICD-10-CM | POA: Diagnosis not present

## 2018-07-10 DIAGNOSIS — N186 End stage renal disease: Secondary | ICD-10-CM | POA: Diagnosis not present

## 2018-07-10 DIAGNOSIS — D631 Anemia in chronic kidney disease: Secondary | ICD-10-CM | POA: Diagnosis not present

## 2018-07-10 DIAGNOSIS — E1129 Type 2 diabetes mellitus with other diabetic kidney complication: Secondary | ICD-10-CM | POA: Diagnosis not present

## 2018-07-10 DIAGNOSIS — E875 Hyperkalemia: Secondary | ICD-10-CM | POA: Diagnosis not present

## 2018-07-10 DIAGNOSIS — N2581 Secondary hyperparathyroidism of renal origin: Secondary | ICD-10-CM | POA: Diagnosis not present

## 2018-07-13 DIAGNOSIS — N186 End stage renal disease: Secondary | ICD-10-CM | POA: Diagnosis not present

## 2018-07-13 DIAGNOSIS — N2581 Secondary hyperparathyroidism of renal origin: Secondary | ICD-10-CM | POA: Diagnosis not present

## 2018-07-13 DIAGNOSIS — E1129 Type 2 diabetes mellitus with other diabetic kidney complication: Secondary | ICD-10-CM | POA: Diagnosis not present

## 2018-07-13 DIAGNOSIS — E875 Hyperkalemia: Secondary | ICD-10-CM | POA: Diagnosis not present

## 2018-07-13 DIAGNOSIS — D631 Anemia in chronic kidney disease: Secondary | ICD-10-CM | POA: Diagnosis not present

## 2018-07-15 DIAGNOSIS — E875 Hyperkalemia: Secondary | ICD-10-CM | POA: Diagnosis not present

## 2018-07-15 DIAGNOSIS — E1129 Type 2 diabetes mellitus with other diabetic kidney complication: Secondary | ICD-10-CM | POA: Diagnosis not present

## 2018-07-15 DIAGNOSIS — N2581 Secondary hyperparathyroidism of renal origin: Secondary | ICD-10-CM | POA: Diagnosis not present

## 2018-07-15 DIAGNOSIS — D631 Anemia in chronic kidney disease: Secondary | ICD-10-CM | POA: Diagnosis not present

## 2018-07-15 DIAGNOSIS — N186 End stage renal disease: Secondary | ICD-10-CM | POA: Diagnosis not present

## 2018-07-16 DIAGNOSIS — I871 Compression of vein: Secondary | ICD-10-CM | POA: Diagnosis not present

## 2018-07-16 DIAGNOSIS — N186 End stage renal disease: Secondary | ICD-10-CM | POA: Diagnosis not present

## 2018-07-16 DIAGNOSIS — Z992 Dependence on renal dialysis: Secondary | ICD-10-CM | POA: Diagnosis not present

## 2018-07-16 DIAGNOSIS — T82858A Stenosis of vascular prosthetic devices, implants and grafts, initial encounter: Secondary | ICD-10-CM | POA: Diagnosis not present

## 2018-07-16 DIAGNOSIS — I158 Other secondary hypertension: Secondary | ICD-10-CM | POA: Diagnosis not present

## 2018-07-17 DIAGNOSIS — N2581 Secondary hyperparathyroidism of renal origin: Secondary | ICD-10-CM | POA: Diagnosis not present

## 2018-07-17 DIAGNOSIS — E1129 Type 2 diabetes mellitus with other diabetic kidney complication: Secondary | ICD-10-CM | POA: Diagnosis not present

## 2018-07-17 DIAGNOSIS — E875 Hyperkalemia: Secondary | ICD-10-CM | POA: Diagnosis not present

## 2018-07-17 DIAGNOSIS — D631 Anemia in chronic kidney disease: Secondary | ICD-10-CM | POA: Diagnosis not present

## 2018-07-17 DIAGNOSIS — N186 End stage renal disease: Secondary | ICD-10-CM | POA: Diagnosis not present

## 2018-07-20 DIAGNOSIS — E1129 Type 2 diabetes mellitus with other diabetic kidney complication: Secondary | ICD-10-CM | POA: Diagnosis not present

## 2018-07-20 DIAGNOSIS — E875 Hyperkalemia: Secondary | ICD-10-CM | POA: Diagnosis not present

## 2018-07-20 DIAGNOSIS — N2581 Secondary hyperparathyroidism of renal origin: Secondary | ICD-10-CM | POA: Diagnosis not present

## 2018-07-20 DIAGNOSIS — N186 End stage renal disease: Secondary | ICD-10-CM | POA: Diagnosis not present

## 2018-07-20 DIAGNOSIS — D631 Anemia in chronic kidney disease: Secondary | ICD-10-CM | POA: Diagnosis not present

## 2018-07-22 DIAGNOSIS — E1129 Type 2 diabetes mellitus with other diabetic kidney complication: Secondary | ICD-10-CM | POA: Diagnosis not present

## 2018-07-22 DIAGNOSIS — N186 End stage renal disease: Secondary | ICD-10-CM | POA: Diagnosis not present

## 2018-07-22 DIAGNOSIS — N2581 Secondary hyperparathyroidism of renal origin: Secondary | ICD-10-CM | POA: Diagnosis not present

## 2018-07-22 DIAGNOSIS — E875 Hyperkalemia: Secondary | ICD-10-CM | POA: Diagnosis not present

## 2018-07-22 DIAGNOSIS — D631 Anemia in chronic kidney disease: Secondary | ICD-10-CM | POA: Diagnosis not present

## 2018-07-24 DIAGNOSIS — D631 Anemia in chronic kidney disease: Secondary | ICD-10-CM | POA: Diagnosis not present

## 2018-07-24 DIAGNOSIS — N2581 Secondary hyperparathyroidism of renal origin: Secondary | ICD-10-CM | POA: Diagnosis not present

## 2018-07-24 DIAGNOSIS — N186 End stage renal disease: Secondary | ICD-10-CM | POA: Diagnosis not present

## 2018-07-24 DIAGNOSIS — E1129 Type 2 diabetes mellitus with other diabetic kidney complication: Secondary | ICD-10-CM | POA: Diagnosis not present

## 2018-07-24 DIAGNOSIS — E875 Hyperkalemia: Secondary | ICD-10-CM | POA: Diagnosis not present

## 2018-07-27 DIAGNOSIS — E1129 Type 2 diabetes mellitus with other diabetic kidney complication: Secondary | ICD-10-CM | POA: Diagnosis not present

## 2018-07-27 DIAGNOSIS — N186 End stage renal disease: Secondary | ICD-10-CM | POA: Diagnosis not present

## 2018-07-27 DIAGNOSIS — D631 Anemia in chronic kidney disease: Secondary | ICD-10-CM | POA: Diagnosis not present

## 2018-07-27 DIAGNOSIS — N2581 Secondary hyperparathyroidism of renal origin: Secondary | ICD-10-CM | POA: Diagnosis not present

## 2018-07-27 DIAGNOSIS — E875 Hyperkalemia: Secondary | ICD-10-CM | POA: Diagnosis not present

## 2018-07-29 DIAGNOSIS — N186 End stage renal disease: Secondary | ICD-10-CM | POA: Diagnosis not present

## 2018-07-29 DIAGNOSIS — E1129 Type 2 diabetes mellitus with other diabetic kidney complication: Secondary | ICD-10-CM | POA: Diagnosis not present

## 2018-07-29 DIAGNOSIS — D631 Anemia in chronic kidney disease: Secondary | ICD-10-CM | POA: Diagnosis not present

## 2018-07-29 DIAGNOSIS — E875 Hyperkalemia: Secondary | ICD-10-CM | POA: Diagnosis not present

## 2018-07-29 DIAGNOSIS — N2581 Secondary hyperparathyroidism of renal origin: Secondary | ICD-10-CM | POA: Diagnosis not present

## 2018-07-31 DIAGNOSIS — D631 Anemia in chronic kidney disease: Secondary | ICD-10-CM | POA: Diagnosis not present

## 2018-07-31 DIAGNOSIS — E875 Hyperkalemia: Secondary | ICD-10-CM | POA: Diagnosis not present

## 2018-07-31 DIAGNOSIS — E1129 Type 2 diabetes mellitus with other diabetic kidney complication: Secondary | ICD-10-CM | POA: Diagnosis not present

## 2018-07-31 DIAGNOSIS — N2581 Secondary hyperparathyroidism of renal origin: Secondary | ICD-10-CM | POA: Diagnosis not present

## 2018-07-31 DIAGNOSIS — N186 End stage renal disease: Secondary | ICD-10-CM | POA: Diagnosis not present

## 2018-08-03 DIAGNOSIS — D631 Anemia in chronic kidney disease: Secondary | ICD-10-CM | POA: Diagnosis not present

## 2018-08-03 DIAGNOSIS — E1129 Type 2 diabetes mellitus with other diabetic kidney complication: Secondary | ICD-10-CM | POA: Diagnosis not present

## 2018-08-03 DIAGNOSIS — N2581 Secondary hyperparathyroidism of renal origin: Secondary | ICD-10-CM | POA: Diagnosis not present

## 2018-08-03 DIAGNOSIS — N186 End stage renal disease: Secondary | ICD-10-CM | POA: Diagnosis not present

## 2018-08-03 DIAGNOSIS — E875 Hyperkalemia: Secondary | ICD-10-CM | POA: Diagnosis not present

## 2018-08-05 DIAGNOSIS — E1129 Type 2 diabetes mellitus with other diabetic kidney complication: Secondary | ICD-10-CM | POA: Diagnosis not present

## 2018-08-05 DIAGNOSIS — E875 Hyperkalemia: Secondary | ICD-10-CM | POA: Diagnosis not present

## 2018-08-05 DIAGNOSIS — N186 End stage renal disease: Secondary | ICD-10-CM | POA: Diagnosis not present

## 2018-08-05 DIAGNOSIS — D631 Anemia in chronic kidney disease: Secondary | ICD-10-CM | POA: Diagnosis not present

## 2018-08-05 DIAGNOSIS — N2581 Secondary hyperparathyroidism of renal origin: Secondary | ICD-10-CM | POA: Diagnosis not present

## 2018-08-07 DIAGNOSIS — N2581 Secondary hyperparathyroidism of renal origin: Secondary | ICD-10-CM | POA: Diagnosis not present

## 2018-08-07 DIAGNOSIS — E875 Hyperkalemia: Secondary | ICD-10-CM | POA: Diagnosis not present

## 2018-08-07 DIAGNOSIS — N186 End stage renal disease: Secondary | ICD-10-CM | POA: Diagnosis not present

## 2018-08-07 DIAGNOSIS — E1129 Type 2 diabetes mellitus with other diabetic kidney complication: Secondary | ICD-10-CM | POA: Diagnosis not present

## 2018-08-07 DIAGNOSIS — D631 Anemia in chronic kidney disease: Secondary | ICD-10-CM | POA: Diagnosis not present

## 2018-08-10 DIAGNOSIS — E875 Hyperkalemia: Secondary | ICD-10-CM | POA: Diagnosis not present

## 2018-08-10 DIAGNOSIS — E1129 Type 2 diabetes mellitus with other diabetic kidney complication: Secondary | ICD-10-CM | POA: Diagnosis not present

## 2018-08-10 DIAGNOSIS — D631 Anemia in chronic kidney disease: Secondary | ICD-10-CM | POA: Diagnosis not present

## 2018-08-10 DIAGNOSIS — N186 End stage renal disease: Secondary | ICD-10-CM | POA: Diagnosis not present

## 2018-08-10 DIAGNOSIS — N2581 Secondary hyperparathyroidism of renal origin: Secondary | ICD-10-CM | POA: Diagnosis not present

## 2018-08-12 DIAGNOSIS — D631 Anemia in chronic kidney disease: Secondary | ICD-10-CM | POA: Diagnosis not present

## 2018-08-12 DIAGNOSIS — E1129 Type 2 diabetes mellitus with other diabetic kidney complication: Secondary | ICD-10-CM | POA: Diagnosis not present

## 2018-08-12 DIAGNOSIS — E875 Hyperkalemia: Secondary | ICD-10-CM | POA: Diagnosis not present

## 2018-08-12 DIAGNOSIS — N2581 Secondary hyperparathyroidism of renal origin: Secondary | ICD-10-CM | POA: Diagnosis not present

## 2018-08-12 DIAGNOSIS — N186 End stage renal disease: Secondary | ICD-10-CM | POA: Diagnosis not present

## 2018-08-14 DIAGNOSIS — N186 End stage renal disease: Secondary | ICD-10-CM | POA: Diagnosis not present

## 2018-08-14 DIAGNOSIS — E1129 Type 2 diabetes mellitus with other diabetic kidney complication: Secondary | ICD-10-CM | POA: Diagnosis not present

## 2018-08-14 DIAGNOSIS — D631 Anemia in chronic kidney disease: Secondary | ICD-10-CM | POA: Diagnosis not present

## 2018-08-14 DIAGNOSIS — N2581 Secondary hyperparathyroidism of renal origin: Secondary | ICD-10-CM | POA: Diagnosis not present

## 2018-08-14 DIAGNOSIS — E875 Hyperkalemia: Secondary | ICD-10-CM | POA: Diagnosis not present

## 2018-08-16 DIAGNOSIS — E1122 Type 2 diabetes mellitus with diabetic chronic kidney disease: Secondary | ICD-10-CM | POA: Diagnosis not present

## 2018-08-16 DIAGNOSIS — Z992 Dependence on renal dialysis: Secondary | ICD-10-CM | POA: Diagnosis not present

## 2018-08-16 DIAGNOSIS — N2581 Secondary hyperparathyroidism of renal origin: Secondary | ICD-10-CM | POA: Diagnosis not present

## 2018-08-16 DIAGNOSIS — N186 End stage renal disease: Secondary | ICD-10-CM | POA: Diagnosis not present

## 2018-08-17 DIAGNOSIS — D631 Anemia in chronic kidney disease: Secondary | ICD-10-CM | POA: Diagnosis not present

## 2018-08-17 DIAGNOSIS — D509 Iron deficiency anemia, unspecified: Secondary | ICD-10-CM | POA: Diagnosis not present

## 2018-08-17 DIAGNOSIS — N2581 Secondary hyperparathyroidism of renal origin: Secondary | ICD-10-CM | POA: Diagnosis not present

## 2018-08-17 DIAGNOSIS — N186 End stage renal disease: Secondary | ICD-10-CM | POA: Diagnosis not present

## 2018-08-19 DIAGNOSIS — N2581 Secondary hyperparathyroidism of renal origin: Secondary | ICD-10-CM | POA: Diagnosis not present

## 2018-08-19 DIAGNOSIS — D509 Iron deficiency anemia, unspecified: Secondary | ICD-10-CM | POA: Diagnosis not present

## 2018-08-19 DIAGNOSIS — D631 Anemia in chronic kidney disease: Secondary | ICD-10-CM | POA: Diagnosis not present

## 2018-08-19 DIAGNOSIS — N186 End stage renal disease: Secondary | ICD-10-CM | POA: Diagnosis not present

## 2018-08-21 DIAGNOSIS — D509 Iron deficiency anemia, unspecified: Secondary | ICD-10-CM | POA: Diagnosis not present

## 2018-08-21 DIAGNOSIS — N186 End stage renal disease: Secondary | ICD-10-CM | POA: Diagnosis not present

## 2018-08-21 DIAGNOSIS — D631 Anemia in chronic kidney disease: Secondary | ICD-10-CM | POA: Diagnosis not present

## 2018-08-21 DIAGNOSIS — N2581 Secondary hyperparathyroidism of renal origin: Secondary | ICD-10-CM | POA: Diagnosis not present

## 2018-08-26 DIAGNOSIS — D631 Anemia in chronic kidney disease: Secondary | ICD-10-CM | POA: Diagnosis not present

## 2018-08-26 DIAGNOSIS — N186 End stage renal disease: Secondary | ICD-10-CM | POA: Diagnosis not present

## 2018-08-26 DIAGNOSIS — D509 Iron deficiency anemia, unspecified: Secondary | ICD-10-CM | POA: Diagnosis not present

## 2018-08-26 DIAGNOSIS — N2581 Secondary hyperparathyroidism of renal origin: Secondary | ICD-10-CM | POA: Diagnosis not present

## 2018-08-28 DIAGNOSIS — D631 Anemia in chronic kidney disease: Secondary | ICD-10-CM | POA: Diagnosis not present

## 2018-08-28 DIAGNOSIS — N2581 Secondary hyperparathyroidism of renal origin: Secondary | ICD-10-CM | POA: Diagnosis not present

## 2018-08-28 DIAGNOSIS — D509 Iron deficiency anemia, unspecified: Secondary | ICD-10-CM | POA: Diagnosis not present

## 2018-08-28 DIAGNOSIS — N186 End stage renal disease: Secondary | ICD-10-CM | POA: Diagnosis not present

## 2018-08-31 DIAGNOSIS — N186 End stage renal disease: Secondary | ICD-10-CM | POA: Diagnosis not present

## 2018-08-31 DIAGNOSIS — N2581 Secondary hyperparathyroidism of renal origin: Secondary | ICD-10-CM | POA: Diagnosis not present

## 2018-08-31 DIAGNOSIS — D631 Anemia in chronic kidney disease: Secondary | ICD-10-CM | POA: Diagnosis not present

## 2018-08-31 DIAGNOSIS — D509 Iron deficiency anemia, unspecified: Secondary | ICD-10-CM | POA: Diagnosis not present

## 2018-09-02 DIAGNOSIS — N186 End stage renal disease: Secondary | ICD-10-CM | POA: Diagnosis not present

## 2018-09-02 DIAGNOSIS — D631 Anemia in chronic kidney disease: Secondary | ICD-10-CM | POA: Diagnosis not present

## 2018-09-02 DIAGNOSIS — N2581 Secondary hyperparathyroidism of renal origin: Secondary | ICD-10-CM | POA: Diagnosis not present

## 2018-09-02 DIAGNOSIS — D509 Iron deficiency anemia, unspecified: Secondary | ICD-10-CM | POA: Diagnosis not present

## 2018-09-04 DIAGNOSIS — N186 End stage renal disease: Secondary | ICD-10-CM | POA: Diagnosis not present

## 2018-09-04 DIAGNOSIS — D631 Anemia in chronic kidney disease: Secondary | ICD-10-CM | POA: Diagnosis not present

## 2018-09-04 DIAGNOSIS — D509 Iron deficiency anemia, unspecified: Secondary | ICD-10-CM | POA: Diagnosis not present

## 2018-09-04 DIAGNOSIS — N2581 Secondary hyperparathyroidism of renal origin: Secondary | ICD-10-CM | POA: Diagnosis not present

## 2018-09-07 DIAGNOSIS — D631 Anemia in chronic kidney disease: Secondary | ICD-10-CM | POA: Diagnosis not present

## 2018-09-07 DIAGNOSIS — D509 Iron deficiency anemia, unspecified: Secondary | ICD-10-CM | POA: Diagnosis not present

## 2018-09-07 DIAGNOSIS — N2581 Secondary hyperparathyroidism of renal origin: Secondary | ICD-10-CM | POA: Diagnosis not present

## 2018-09-07 DIAGNOSIS — N186 End stage renal disease: Secondary | ICD-10-CM | POA: Diagnosis not present

## 2018-09-09 DIAGNOSIS — D631 Anemia in chronic kidney disease: Secondary | ICD-10-CM | POA: Diagnosis not present

## 2018-09-09 DIAGNOSIS — D509 Iron deficiency anemia, unspecified: Secondary | ICD-10-CM | POA: Diagnosis not present

## 2018-09-09 DIAGNOSIS — N2581 Secondary hyperparathyroidism of renal origin: Secondary | ICD-10-CM | POA: Diagnosis not present

## 2018-09-09 DIAGNOSIS — N186 End stage renal disease: Secondary | ICD-10-CM | POA: Diagnosis not present

## 2018-09-11 DIAGNOSIS — N186 End stage renal disease: Secondary | ICD-10-CM | POA: Diagnosis not present

## 2018-09-11 DIAGNOSIS — D509 Iron deficiency anemia, unspecified: Secondary | ICD-10-CM | POA: Diagnosis not present

## 2018-09-11 DIAGNOSIS — N2581 Secondary hyperparathyroidism of renal origin: Secondary | ICD-10-CM | POA: Diagnosis not present

## 2018-09-11 DIAGNOSIS — D631 Anemia in chronic kidney disease: Secondary | ICD-10-CM | POA: Diagnosis not present

## 2018-09-14 DIAGNOSIS — D509 Iron deficiency anemia, unspecified: Secondary | ICD-10-CM | POA: Diagnosis not present

## 2018-09-14 DIAGNOSIS — D631 Anemia in chronic kidney disease: Secondary | ICD-10-CM | POA: Diagnosis not present

## 2018-09-14 DIAGNOSIS — N186 End stage renal disease: Secondary | ICD-10-CM | POA: Diagnosis not present

## 2018-09-14 DIAGNOSIS — N2581 Secondary hyperparathyroidism of renal origin: Secondary | ICD-10-CM | POA: Diagnosis not present

## 2018-09-15 DIAGNOSIS — E1122 Type 2 diabetes mellitus with diabetic chronic kidney disease: Secondary | ICD-10-CM | POA: Diagnosis not present

## 2018-09-15 DIAGNOSIS — I10 Essential (primary) hypertension: Secondary | ICD-10-CM | POA: Diagnosis not present

## 2018-09-15 DIAGNOSIS — N186 End stage renal disease: Secondary | ICD-10-CM | POA: Diagnosis not present

## 2018-09-15 DIAGNOSIS — Z992 Dependence on renal dialysis: Secondary | ICD-10-CM | POA: Diagnosis not present

## 2018-09-16 DIAGNOSIS — Z23 Encounter for immunization: Secondary | ICD-10-CM | POA: Diagnosis not present

## 2018-09-16 DIAGNOSIS — D631 Anemia in chronic kidney disease: Secondary | ICD-10-CM | POA: Diagnosis not present

## 2018-09-16 DIAGNOSIS — N2581 Secondary hyperparathyroidism of renal origin: Secondary | ICD-10-CM | POA: Diagnosis not present

## 2018-09-16 DIAGNOSIS — Z992 Dependence on renal dialysis: Secondary | ICD-10-CM | POA: Diagnosis not present

## 2018-09-16 DIAGNOSIS — D509 Iron deficiency anemia, unspecified: Secondary | ICD-10-CM | POA: Diagnosis not present

## 2018-09-16 DIAGNOSIS — E1122 Type 2 diabetes mellitus with diabetic chronic kidney disease: Secondary | ICD-10-CM | POA: Diagnosis not present

## 2018-09-16 DIAGNOSIS — N186 End stage renal disease: Secondary | ICD-10-CM | POA: Diagnosis not present

## 2018-09-18 DIAGNOSIS — N2581 Secondary hyperparathyroidism of renal origin: Secondary | ICD-10-CM | POA: Diagnosis not present

## 2018-09-18 DIAGNOSIS — Z992 Dependence on renal dialysis: Secondary | ICD-10-CM | POA: Diagnosis not present

## 2018-09-18 DIAGNOSIS — D631 Anemia in chronic kidney disease: Secondary | ICD-10-CM | POA: Diagnosis not present

## 2018-09-18 DIAGNOSIS — N186 End stage renal disease: Secondary | ICD-10-CM | POA: Diagnosis not present

## 2018-09-18 DIAGNOSIS — D509 Iron deficiency anemia, unspecified: Secondary | ICD-10-CM | POA: Diagnosis not present

## 2018-09-21 DIAGNOSIS — B351 Tinea unguium: Secondary | ICD-10-CM | POA: Diagnosis not present

## 2018-09-21 DIAGNOSIS — N186 End stage renal disease: Secondary | ICD-10-CM | POA: Diagnosis not present

## 2018-09-21 DIAGNOSIS — D631 Anemia in chronic kidney disease: Secondary | ICD-10-CM | POA: Diagnosis not present

## 2018-09-21 DIAGNOSIS — N2581 Secondary hyperparathyroidism of renal origin: Secondary | ICD-10-CM | POA: Diagnosis not present

## 2018-09-21 DIAGNOSIS — E1142 Type 2 diabetes mellitus with diabetic polyneuropathy: Secondary | ICD-10-CM | POA: Diagnosis not present

## 2018-09-21 DIAGNOSIS — Z992 Dependence on renal dialysis: Secondary | ICD-10-CM | POA: Diagnosis not present

## 2018-09-21 DIAGNOSIS — D509 Iron deficiency anemia, unspecified: Secondary | ICD-10-CM | POA: Diagnosis not present

## 2018-09-23 DIAGNOSIS — N186 End stage renal disease: Secondary | ICD-10-CM | POA: Diagnosis not present

## 2018-09-23 DIAGNOSIS — Z992 Dependence on renal dialysis: Secondary | ICD-10-CM | POA: Diagnosis not present

## 2018-09-23 DIAGNOSIS — D631 Anemia in chronic kidney disease: Secondary | ICD-10-CM | POA: Diagnosis not present

## 2018-09-23 DIAGNOSIS — N2581 Secondary hyperparathyroidism of renal origin: Secondary | ICD-10-CM | POA: Diagnosis not present

## 2018-09-23 DIAGNOSIS — D509 Iron deficiency anemia, unspecified: Secondary | ICD-10-CM | POA: Diagnosis not present

## 2018-09-25 DIAGNOSIS — D631 Anemia in chronic kidney disease: Secondary | ICD-10-CM | POA: Diagnosis not present

## 2018-09-25 DIAGNOSIS — N2581 Secondary hyperparathyroidism of renal origin: Secondary | ICD-10-CM | POA: Diagnosis not present

## 2018-09-25 DIAGNOSIS — N186 End stage renal disease: Secondary | ICD-10-CM | POA: Diagnosis not present

## 2018-09-25 DIAGNOSIS — D509 Iron deficiency anemia, unspecified: Secondary | ICD-10-CM | POA: Diagnosis not present

## 2018-09-25 DIAGNOSIS — Z992 Dependence on renal dialysis: Secondary | ICD-10-CM | POA: Diagnosis not present

## 2018-09-28 DIAGNOSIS — D631 Anemia in chronic kidney disease: Secondary | ICD-10-CM | POA: Diagnosis not present

## 2018-09-28 DIAGNOSIS — Z992 Dependence on renal dialysis: Secondary | ICD-10-CM | POA: Diagnosis not present

## 2018-09-28 DIAGNOSIS — D509 Iron deficiency anemia, unspecified: Secondary | ICD-10-CM | POA: Diagnosis not present

## 2018-09-28 DIAGNOSIS — N2581 Secondary hyperparathyroidism of renal origin: Secondary | ICD-10-CM | POA: Diagnosis not present

## 2018-09-28 DIAGNOSIS — N186 End stage renal disease: Secondary | ICD-10-CM | POA: Diagnosis not present

## 2018-09-30 DIAGNOSIS — N2581 Secondary hyperparathyroidism of renal origin: Secondary | ICD-10-CM | POA: Diagnosis not present

## 2018-09-30 DIAGNOSIS — D631 Anemia in chronic kidney disease: Secondary | ICD-10-CM | POA: Diagnosis not present

## 2018-09-30 DIAGNOSIS — N186 End stage renal disease: Secondary | ICD-10-CM | POA: Diagnosis not present

## 2018-09-30 DIAGNOSIS — Z992 Dependence on renal dialysis: Secondary | ICD-10-CM | POA: Diagnosis not present

## 2018-09-30 DIAGNOSIS — D509 Iron deficiency anemia, unspecified: Secondary | ICD-10-CM | POA: Diagnosis not present

## 2018-10-01 DIAGNOSIS — Z992 Dependence on renal dialysis: Secondary | ICD-10-CM | POA: Diagnosis not present

## 2018-10-01 DIAGNOSIS — E785 Hyperlipidemia, unspecified: Secondary | ICD-10-CM | POA: Diagnosis not present

## 2018-10-01 DIAGNOSIS — E11649 Type 2 diabetes mellitus with hypoglycemia without coma: Secondary | ICD-10-CM | POA: Diagnosis not present

## 2018-10-01 DIAGNOSIS — M064 Inflammatory polyarthropathy: Secondary | ICD-10-CM | POA: Diagnosis not present

## 2018-10-01 DIAGNOSIS — M1A3710 Chronic gout due to renal impairment, right ankle and foot, without tophus (tophi): Secondary | ICD-10-CM | POA: Diagnosis not present

## 2018-10-01 DIAGNOSIS — I10 Essential (primary) hypertension: Secondary | ICD-10-CM | POA: Diagnosis not present

## 2018-10-01 DIAGNOSIS — E559 Vitamin D deficiency, unspecified: Secondary | ICD-10-CM | POA: Diagnosis not present

## 2018-10-01 DIAGNOSIS — M359 Systemic involvement of connective tissue, unspecified: Secondary | ICD-10-CM | POA: Diagnosis not present

## 2018-10-01 DIAGNOSIS — Z8719 Personal history of other diseases of the digestive system: Secondary | ICD-10-CM | POA: Diagnosis not present

## 2018-10-01 DIAGNOSIS — Z8679 Personal history of other diseases of the circulatory system: Secondary | ICD-10-CM | POA: Diagnosis not present

## 2018-10-01 DIAGNOSIS — M109 Gout, unspecified: Secondary | ICD-10-CM | POA: Diagnosis not present

## 2018-10-01 DIAGNOSIS — E1165 Type 2 diabetes mellitus with hyperglycemia: Secondary | ICD-10-CM | POA: Diagnosis not present

## 2018-10-01 DIAGNOSIS — N186 End stage renal disease: Secondary | ICD-10-CM | POA: Diagnosis not present

## 2018-10-02 DIAGNOSIS — N186 End stage renal disease: Secondary | ICD-10-CM | POA: Diagnosis not present

## 2018-10-02 DIAGNOSIS — D631 Anemia in chronic kidney disease: Secondary | ICD-10-CM | POA: Diagnosis not present

## 2018-10-02 DIAGNOSIS — Z992 Dependence on renal dialysis: Secondary | ICD-10-CM | POA: Diagnosis not present

## 2018-10-02 DIAGNOSIS — D509 Iron deficiency anemia, unspecified: Secondary | ICD-10-CM | POA: Diagnosis not present

## 2018-10-02 DIAGNOSIS — N2581 Secondary hyperparathyroidism of renal origin: Secondary | ICD-10-CM | POA: Diagnosis not present

## 2018-10-05 DIAGNOSIS — D509 Iron deficiency anemia, unspecified: Secondary | ICD-10-CM | POA: Diagnosis not present

## 2018-10-05 DIAGNOSIS — N2581 Secondary hyperparathyroidism of renal origin: Secondary | ICD-10-CM | POA: Diagnosis not present

## 2018-10-05 DIAGNOSIS — N186 End stage renal disease: Secondary | ICD-10-CM | POA: Diagnosis not present

## 2018-10-05 DIAGNOSIS — D631 Anemia in chronic kidney disease: Secondary | ICD-10-CM | POA: Diagnosis not present

## 2018-10-05 DIAGNOSIS — Z992 Dependence on renal dialysis: Secondary | ICD-10-CM | POA: Diagnosis not present

## 2018-10-06 DIAGNOSIS — T82858A Stenosis of vascular prosthetic devices, implants and grafts, initial encounter: Secondary | ICD-10-CM | POA: Diagnosis not present

## 2018-10-06 DIAGNOSIS — Z992 Dependence on renal dialysis: Secondary | ICD-10-CM | POA: Diagnosis not present

## 2018-10-06 DIAGNOSIS — N186 End stage renal disease: Secondary | ICD-10-CM | POA: Diagnosis not present

## 2018-10-07 DIAGNOSIS — N186 End stage renal disease: Secondary | ICD-10-CM | POA: Diagnosis not present

## 2018-10-07 DIAGNOSIS — D509 Iron deficiency anemia, unspecified: Secondary | ICD-10-CM | POA: Diagnosis not present

## 2018-10-07 DIAGNOSIS — N2581 Secondary hyperparathyroidism of renal origin: Secondary | ICD-10-CM | POA: Diagnosis not present

## 2018-10-07 DIAGNOSIS — Z992 Dependence on renal dialysis: Secondary | ICD-10-CM | POA: Diagnosis not present

## 2018-10-07 DIAGNOSIS — D631 Anemia in chronic kidney disease: Secondary | ICD-10-CM | POA: Diagnosis not present

## 2018-10-09 DIAGNOSIS — Z992 Dependence on renal dialysis: Secondary | ICD-10-CM | POA: Diagnosis not present

## 2018-10-09 DIAGNOSIS — N186 End stage renal disease: Secondary | ICD-10-CM | POA: Diagnosis not present

## 2018-10-09 DIAGNOSIS — D509 Iron deficiency anemia, unspecified: Secondary | ICD-10-CM | POA: Diagnosis not present

## 2018-10-09 DIAGNOSIS — N2581 Secondary hyperparathyroidism of renal origin: Secondary | ICD-10-CM | POA: Diagnosis not present

## 2018-10-09 DIAGNOSIS — D631 Anemia in chronic kidney disease: Secondary | ICD-10-CM | POA: Diagnosis not present

## 2018-10-12 DIAGNOSIS — N2581 Secondary hyperparathyroidism of renal origin: Secondary | ICD-10-CM | POA: Diagnosis not present

## 2018-10-12 DIAGNOSIS — D509 Iron deficiency anemia, unspecified: Secondary | ICD-10-CM | POA: Diagnosis not present

## 2018-10-12 DIAGNOSIS — N186 End stage renal disease: Secondary | ICD-10-CM | POA: Diagnosis not present

## 2018-10-12 DIAGNOSIS — D631 Anemia in chronic kidney disease: Secondary | ICD-10-CM | POA: Diagnosis not present

## 2018-10-12 DIAGNOSIS — Z992 Dependence on renal dialysis: Secondary | ICD-10-CM | POA: Diagnosis not present

## 2018-10-14 DIAGNOSIS — D509 Iron deficiency anemia, unspecified: Secondary | ICD-10-CM | POA: Diagnosis not present

## 2018-10-14 DIAGNOSIS — N186 End stage renal disease: Secondary | ICD-10-CM | POA: Diagnosis not present

## 2018-10-14 DIAGNOSIS — Z992 Dependence on renal dialysis: Secondary | ICD-10-CM | POA: Diagnosis not present

## 2018-10-14 DIAGNOSIS — D631 Anemia in chronic kidney disease: Secondary | ICD-10-CM | POA: Diagnosis not present

## 2018-10-14 DIAGNOSIS — N2581 Secondary hyperparathyroidism of renal origin: Secondary | ICD-10-CM | POA: Diagnosis not present

## 2018-12-09 IMAGING — RF DG ABDOMEN 1V
1 series · 1 of 1 positions shown · non-contrast
Comparison: No recent prior .

CLINICAL DATA: NG tube placement .

EXAM:
ABDOMEN - 1 VIEW

[Series 1: cp_standard · 0.27mm/px · 1 of 1 slices shown]
[im 1/1]
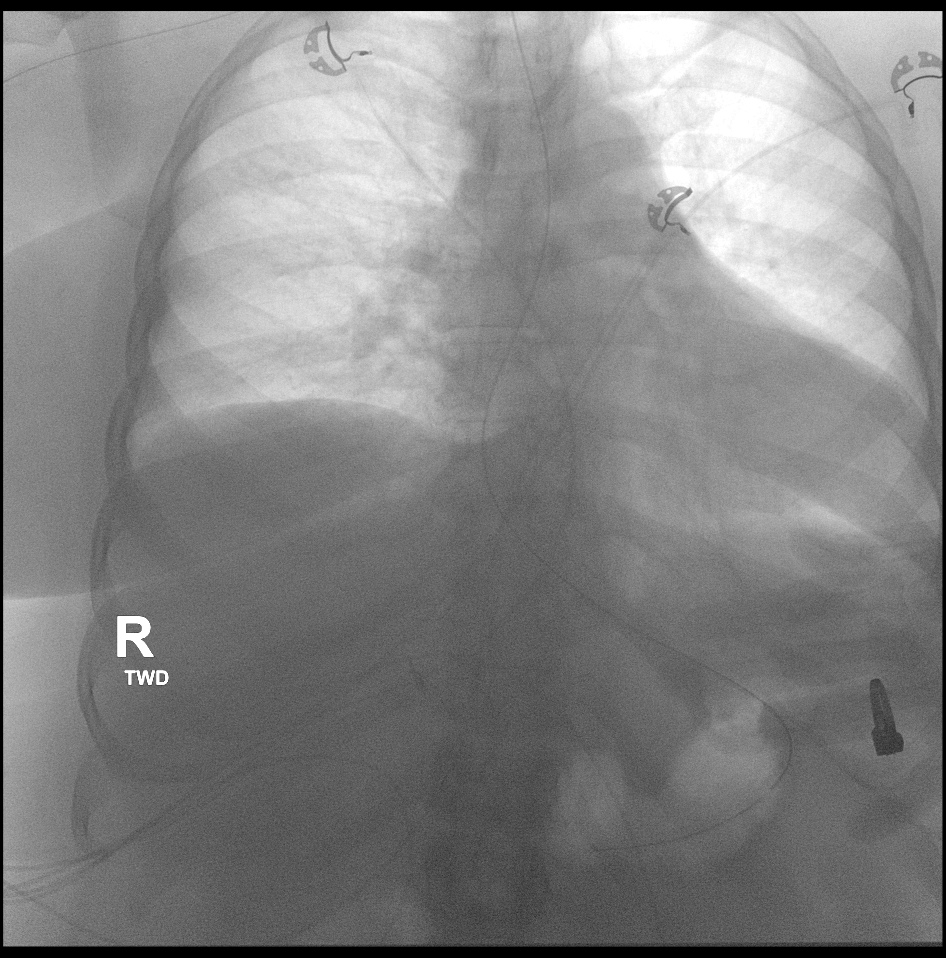

[1 of 1 positions shown; findings below may reference images not displayed]

FINDINGS: NG tube noted with tip projected over the stomach. EKG leads noted
over the chest. Surgical clips right upper quadrant.
IMPRESSION: NG tube noted with tip projected over the stomach.

## 2019-10-05 IMAGING — US US PELVIS COMPLETE TRANSABD/TRANSVAG
1 series · 13 of 25 positions shown · non-contrast
Comparison: None

CLINICAL DATA: Cystic right ovarian lesion on outside CT. Remote
history of ruptured ectopic, with the patient being unsure if the
left ovary and fallopian tube were removed at that time.



[Series 1: us pelvis complete transabd/transvag · 0.24mm/px · 13 of 65 slices shown]
[im 1/65]
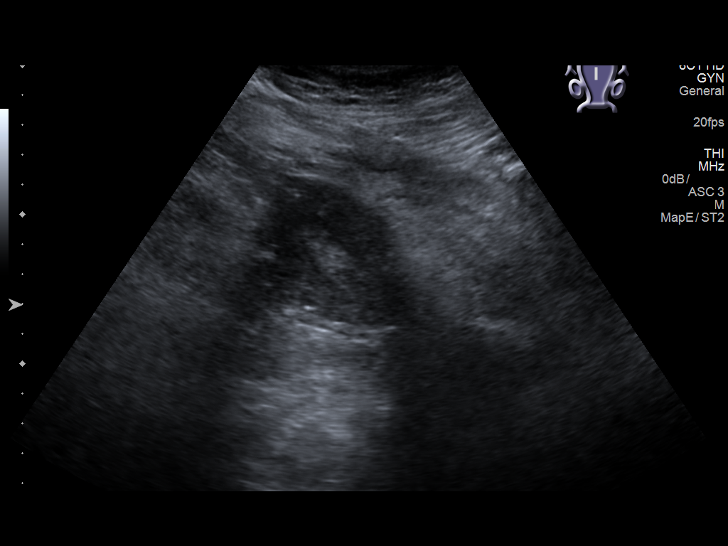
[im 6/65]
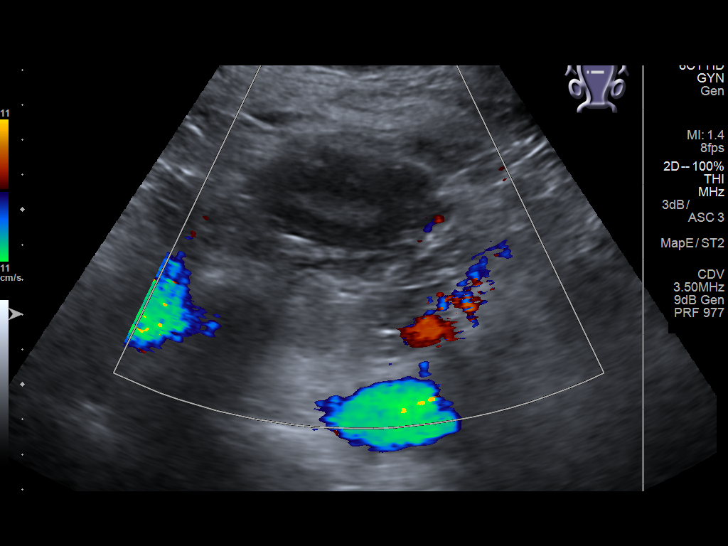
[im 11/65]
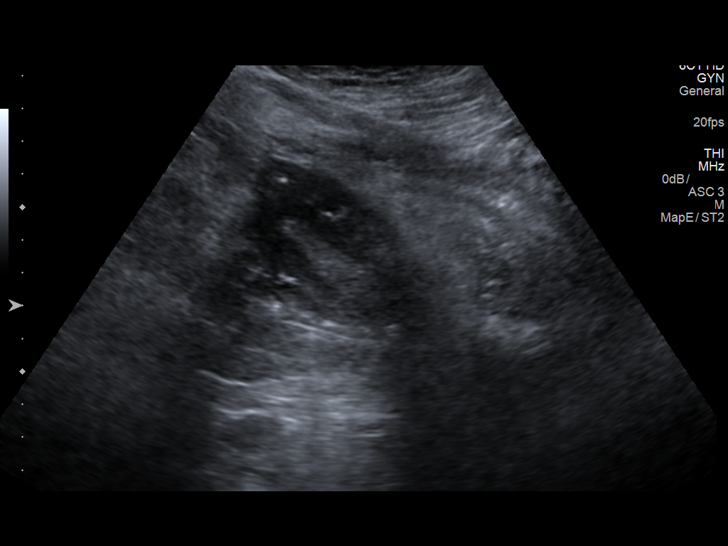
[im 17/65]
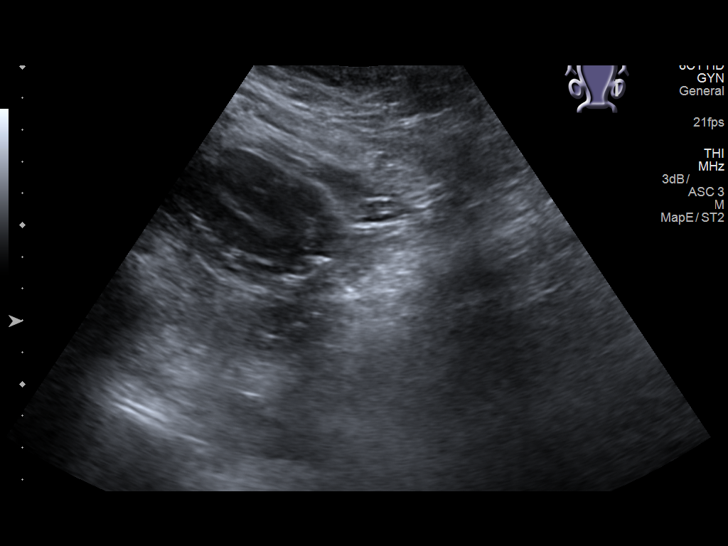
[im 22/65]
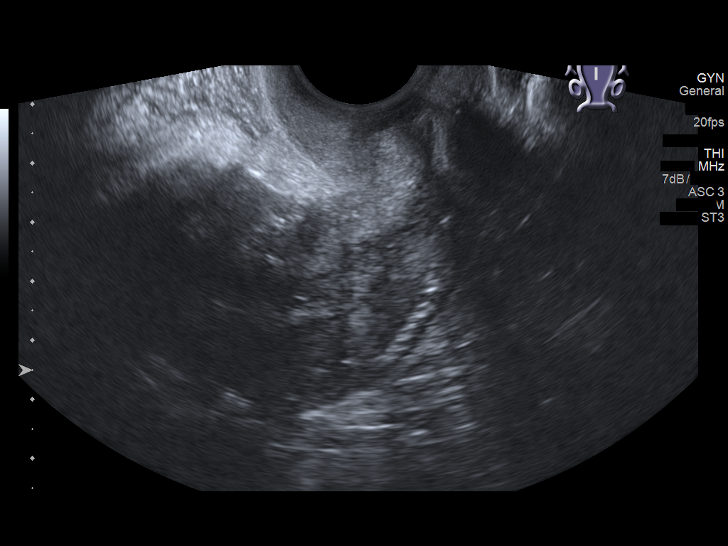
[im 27/65]
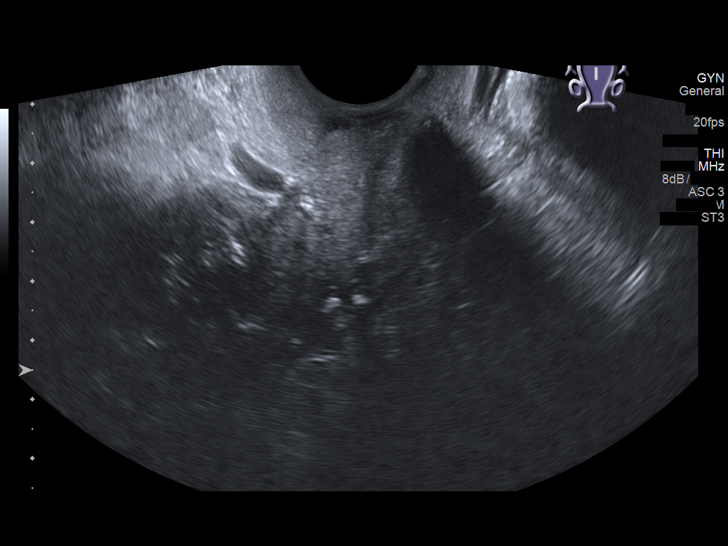
[im 33/65]
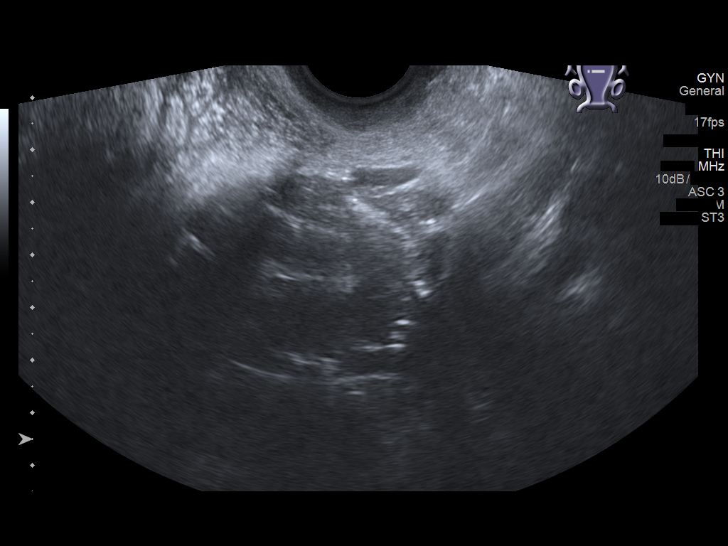
[im 38/65]
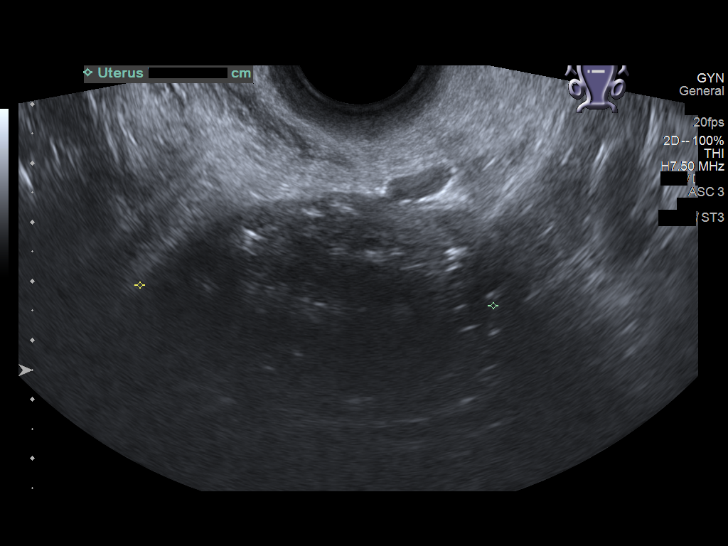
[im 43/65]
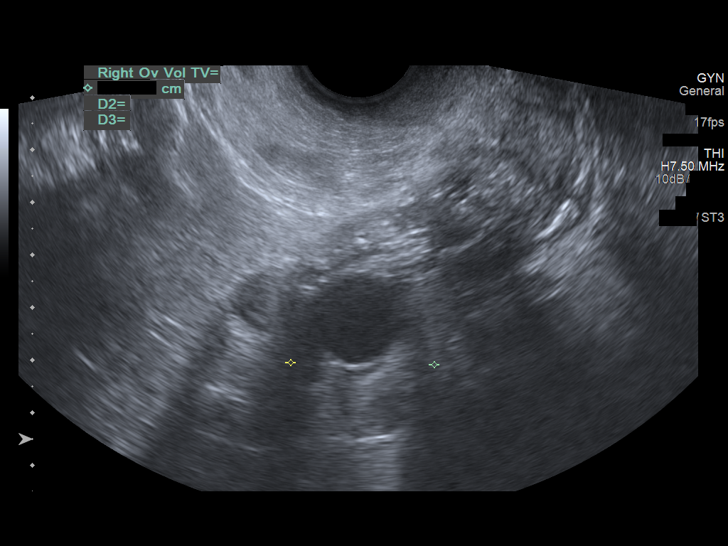
[im 49/65]
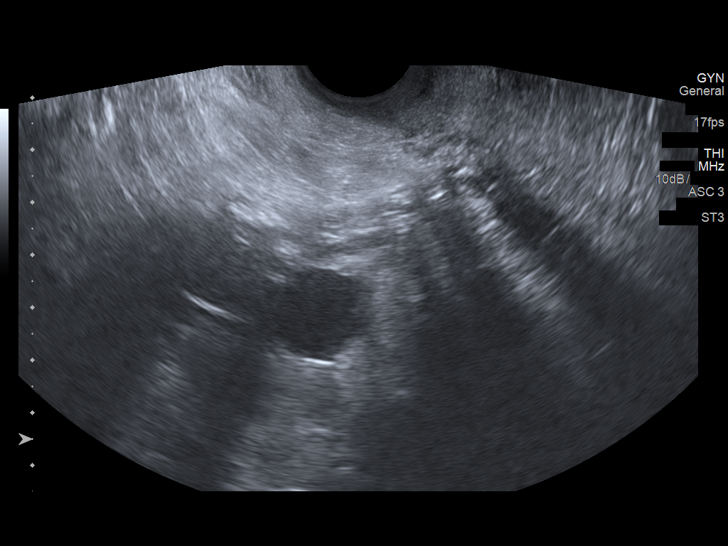
[im 54/65]
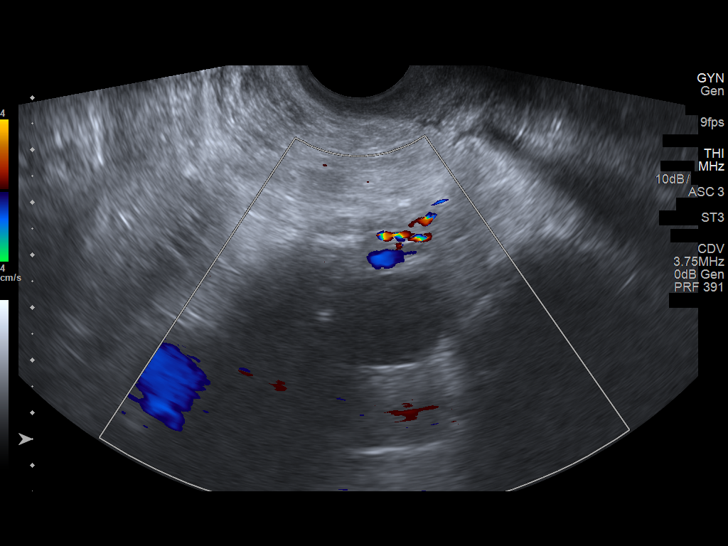
[im 59/65]
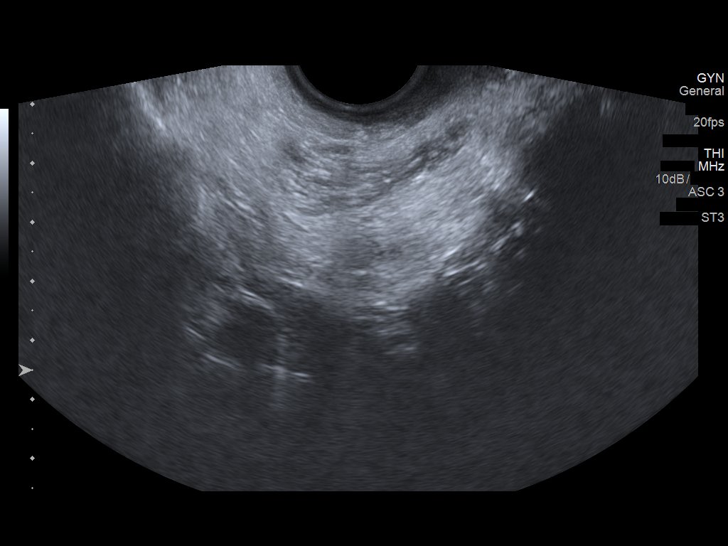
[im 65/65]
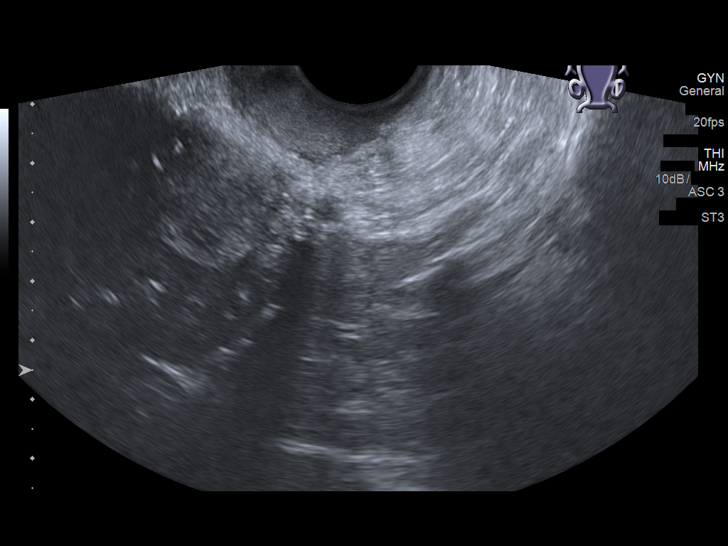

[13 of 25 positions shown; findings below may reference images not displayed]

FINDINGS: Examination is limited by patient body habitus.

Uterus

Measurements: 8.4 x 4.2 x 6.0 cm. 2.2 x 1.4 x 2.2 cm intramural mass
in the left aspect of the uterine fundus with a few echogenic foci
suggesting calcification, most compatible with a fibroid.

Endometrium

Thickness: 8 mm, upper limits of normal in a postmenopausal patient.
No focal abnormality visualized.

Right ovary

Measurements: 2.7 x 4.3 x 5.2 cm. Simple appearing 1.9 x 1.5 x
cm cyst.

Left ovary

Not visualized.

Other findings

No abnormal free fluid.
IMPRESSION: 1. 2.1 cm right ovarian cyst. This is almost certainly benign, but
follow up ultrasound is recommended in 1 year according to the
[employer] in [place] Consensus Conference
Statement (Gerlanio Ranghetti et al. Management of Asymptomatic Ovarian and
Other Adnexal Cysts Imaged at [place]: Society of Radiologists in
[id number]): [phone].).
2. Nonvisualization of the left ovary.
3. 2.2 cm uterine fibroid.
